# Patient Record
Sex: Female | Born: 1975 | Race: White | Hispanic: No | Marital: Married | Smoking: Never smoker
Health system: Southern US, Community
[De-identification: ages and names within clinical notes are randomized; demographics above are authoritative.]

## PROBLEM LIST (undated history)

## (undated) DIAGNOSIS — G43909 Migraine, unspecified, not intractable, without status migrainosus: Secondary | ICD-10-CM

## (undated) DIAGNOSIS — Z9889 Other specified postprocedural states: Secondary | ICD-10-CM

## (undated) DIAGNOSIS — M7070 Other bursitis of hip, unspecified hip: Secondary | ICD-10-CM

## (undated) DIAGNOSIS — R51 Headache: Secondary | ICD-10-CM

## (undated) DIAGNOSIS — L719 Rosacea, unspecified: Secondary | ICD-10-CM

## (undated) DIAGNOSIS — K3184 Gastroparesis: Secondary | ICD-10-CM

## (undated) DIAGNOSIS — F419 Anxiety disorder, unspecified: Secondary | ICD-10-CM

## (undated) DIAGNOSIS — M419 Scoliosis, unspecified: Secondary | ICD-10-CM

## (undated) DIAGNOSIS — Z8489 Family history of other specified conditions: Secondary | ICD-10-CM

## (undated) DIAGNOSIS — K219 Gastro-esophageal reflux disease without esophagitis: Secondary | ICD-10-CM

## (undated) DIAGNOSIS — M1990 Unspecified osteoarthritis, unspecified site: Secondary | ICD-10-CM

## (undated) DIAGNOSIS — Z5189 Encounter for other specified aftercare: Secondary | ICD-10-CM

## (undated) DIAGNOSIS — R112 Nausea with vomiting, unspecified: Secondary | ICD-10-CM

## (undated) DIAGNOSIS — E7800 Pure hypercholesterolemia, unspecified: Secondary | ICD-10-CM

## (undated) HISTORY — DX: Other bursitis of hip, unspecified hip: M70.70

## (undated) HISTORY — DX: Scoliosis, unspecified: M41.9

## (undated) HISTORY — DX: Encounter for other specified aftercare: Z51.89

## (undated) HISTORY — PX: TONSILLECTOMY: SUR1361

## (undated) HISTORY — DX: Gastro-esophageal reflux disease without esophagitis: K21.9

## (undated) HISTORY — DX: Unspecified osteoarthritis, unspecified site: M19.90

## (undated) HISTORY — PX: WISDOM TOOTH EXTRACTION: SHX21

---
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
## 2006-07-18 HISTORY — PX: HAND TENDON SURGERY: SHX663

## 2011-03-15 ENCOUNTER — Emergency Department (HOSPITAL_COMMUNITY)
Admission: EM | Admit: 2011-03-15 | Discharge: 2011-03-15 | Disposition: A | Discharge status: Home / Self Care | Payer: Medicaid Other | Attending: Emergency Medicine | Admitting: Emergency Medicine

## 2011-03-15 ENCOUNTER — Emergency Department (HOSPITAL_COMMUNITY): Payer: Medicaid Other

## 2011-03-15 DIAGNOSIS — M79609 Pain in unspecified limb: Secondary | ICD-10-CM | POA: Insufficient documentation

## 2011-03-15 DIAGNOSIS — M7989 Other specified soft tissue disorders: Secondary | ICD-10-CM | POA: Insufficient documentation

## 2011-03-15 DIAGNOSIS — M129 Arthropathy, unspecified: Secondary | ICD-10-CM | POA: Insufficient documentation

## 2011-03-15 DIAGNOSIS — S62319A Displaced fracture of base of unspecified metacarpal bone, initial encounter for closed fracture: Secondary | ICD-10-CM | POA: Insufficient documentation

## 2011-03-15 DIAGNOSIS — W2209XA Striking against other stationary object, initial encounter: Secondary | ICD-10-CM | POA: Insufficient documentation

## 2011-03-15 DIAGNOSIS — Y92009 Unspecified place in unspecified non-institutional (private) residence as the place of occurrence of the external cause: Secondary | ICD-10-CM | POA: Insufficient documentation

## 2012-09-06 DIAGNOSIS — M47816 Spondylosis without myelopathy or radiculopathy, lumbar region: Secondary | ICD-10-CM | POA: Insufficient documentation

## 2012-09-06 DIAGNOSIS — M545 Low back pain, unspecified: Secondary | ICD-10-CM | POA: Insufficient documentation

## 2013-08-13 ENCOUNTER — Other Ambulatory Visit: Payer: Self-pay | Admitting: Gastroenterology

## 2013-09-09 ENCOUNTER — Encounter (HOSPITAL_COMMUNITY)
Admission: RE | Disposition: A | Discharge status: Home / Self Care | Payer: Self-pay | Source: Ambulatory Visit | Attending: Gastroenterology

## 2013-09-09 ENCOUNTER — Ambulatory Visit (HOSPITAL_COMMUNITY)
Admission: RE | Admit: 2013-09-09 | Discharge: 2013-09-09 | Disposition: A | Discharge status: Home / Self Care | Payer: Medicaid Other | Source: Ambulatory Visit | Attending: Gastroenterology | Admitting: Gastroenterology

## 2013-09-09 DIAGNOSIS — K219 Gastro-esophageal reflux disease without esophagitis: Secondary | ICD-10-CM | POA: Insufficient documentation

## 2013-09-09 HISTORY — PX: ESOPHAGEAL MANOMETRY: SHX5429

## 2013-09-09 SURGERY — MANOMETRY, ESOPHAGUS

## 2013-09-09 MED ORDER — LIDOCAINE VISCOUS 2 % MT SOLN
OROMUCOSAL | Status: AC
  Filled 2013-09-09: qty 15

## 2013-09-09 SURGICAL SUPPLY — 1 items: FACESHIELD LNG OPTICON STERILE (SAFETY) IMPLANT

## 2013-09-10 ENCOUNTER — Encounter (HOSPITAL_COMMUNITY): Payer: Self-pay | Admitting: Gastroenterology

## 2013-09-10 NOTE — H&P (Signed)
   Richardo PriestBelinda Loftus HPI: This is a 38 year old female with a long history of GERD.  She feels that her GERD has worsened.  Omeprazole helps, but she still has significant problems.  The patient most likely requires a surgical evaluation and a manometry is pursued for further evaluation.  No past medical history on file.  Past Surgical History  Procedure Laterality Date  . Esophageal manometry [name] 09/09/2013    Procedure: ESOPHAGEAL MANOMETRY (EM);  Surgeon: Theda BelfastPatrick D Irma Delancey, MD;  Location: [facility];  Service: Endoscopy;  Laterality: [name];    No family history on file.  Social History:  has no tobacco, alcohol, and drug history on file.  Allergies: Allergies not on file  Medications: Scheduled: Continuous:  No results found for this or any previous visit (from the past 24 hour(s)).   No results found.  ROS:  As stated above in the HPI otherwise negative.  Height 5\' 4"  (1.626 m).      Assessment/Plan: 1) GERD.  Plan: 1) Manometry.  Dalonte Hardage D 09/10/2013, 3:31 PM

## 2013-09-27 ENCOUNTER — Ambulatory Visit (INDEPENDENT_AMBULATORY_CARE_PROVIDER_SITE_OTHER): Payer: Medicaid Other | Admitting: Surgery

## 2013-09-27 ENCOUNTER — Encounter (INDEPENDENT_AMBULATORY_CARE_PROVIDER_SITE_OTHER): Payer: Self-pay | Admitting: Surgery

## 2013-09-27 ENCOUNTER — Encounter (INDEPENDENT_AMBULATORY_CARE_PROVIDER_SITE_OTHER): Payer: Self-pay

## 2013-09-27 VITALS — BP 118/80 | HR 74 | Temp 99.5°F | Resp 16 | Ht 63.0 in | Wt 138.0 lb

## 2013-09-27 DIAGNOSIS — K219 Gastro-esophageal reflux disease without esophagitis: Secondary | ICD-10-CM

## 2013-09-27 NOTE — Progress Notes (Signed)
Subjective:     Patient ID: Theresa Shelton, female   DOB: October 27, 1975, 38 y.o.   MRN: [id number]  HPI  Note: This dictation was prepared with Dragon/digital dictation along with [facility] technology. Any transcriptional errors that result from this process are unintentional.       Theresa Shelton  10-20-1975 [id number]  Patient Care Team: Ailene Ravel, MD as PCP - General (Family Medicine) Theda Belfast, MD as Consulting Physician (Gastroenterology) Ardeth Sportsman, MD as Consulting Physician (General Surgery)  This patient is a 38 y.o.female who presents today for surgical evaluation at the request of Dr. Elnoria Howard.   Reason for visit: Worsening GERD intractable to medical management  Pleasant white female does struggle with heartburn and reflux for 15 years.  She comes in today with her very active 21-year-old son.  She had chronic burning to the back of her throat.  Poor food tolerance.  Ice chips/water.  Saw commercial concern for reflux.  Mentioned her primary care physician.  Started on an acid medicines.  She has been on most H2 blockers and proton pump inhibitors since that time.  Usually a medicine will help for 6 months.  Then things will dwindle in effectiveness.  Medicines will be increased or switched.  Then that seems to help for 3-6 motnhs.  She is made major diet modifications.  She stopped soda, fast food, caffeine, chocolate.  She is on a more organic Paleo type diet.  Those have helped.  She is intentionally lost 40 pounds of weight.  However Things have progressive [name] worsened and now the medicines, weight loss, and diet changes have Stopped being in a more effective and she still struggles appear  She still struggles with reflux.  Could not tolerate lying flat or bending over unless has her medicines.  Symptoms get markedly worse if she misses a dose.  She has had a couple his episodes of severe epigastric and chest pain.  Felt not to be cardiac or pulmonary etiology.   Normally hikes with her family 4-6 miles without problems.  3 bowel movements a day.  No personal nor family history of GI/colon cancer, inflammatory bowel disease, irritable bowel syndrome, allergy such as Celiac Sprue, dietary/dairy problems, colitis, ulcers nor gastritis.  No recent sick contacts/gastroenteritis.  No travel outside the country.  No changes in diet.  No dysphagia to solids or liquids.  No hematochezia, hematemesis, coffee ground emesis.  No evidence of prior gastric/peptic ulceration.  She saw gastroenterology.  Dr. Elnoria Howard performed endoscopy 2 years ago.  She recalls being told it was underwhelming.  I do not have the report in front of me.  Not able to get that record today.  Dr. Elnoria Howard saw the patient again this year.  On the significant acid suppression.  Still struggling.  Manometry performed over concerns of crampy chest pain perhaps being esophageal spasms.  That is normal.  Surgical consultation requested to see if benefit of fundoplication for better control of significant GERD    Patient Active Problem List   Diagnosis Date Noted  . GERD (gastroesophageal reflux disease) 09/27/2013    Past Medical History  Diagnosis Date  . GERD (gastroesophageal reflux disease)   . Blood transfusion without reported diagnosis   . Arthritis     Past Surgical History  Procedure Laterality Date  . Esophageal manometry [name] 09/09/2013    Procedure: ESOPHAGEAL MANOMETRY (EM);  Surgeon: Theda Belfast, MD;  Location: [facility];  Service: Endoscopy;  Laterality: [name];  History   Social History  . Marital Status: Single    Spouse Name: [name]    Number of Children: [name]  . Years of Education: [name]   Occupational History  . Not on file.   Social History Main Topics  . Smoking status: Never Smoker   . Smokeless tobacco: Not on file  . Alcohol Use: Yes  . Drug Use: No  . Sexual Activity: Not on file   Other Topics Concern  . Not on file   Social History Narrative  . No  narrative on file    History reviewed. No pertinent family history.  Current Outpatient Prescriptions  Medication Sig Dispense Refill  . Levonorgestrel-Ethinyl Estradiol (AMETHIA,CAMRESE) 0.1-0.02 & 0.01 MG tablet Take 1 tablet by mouth daily.      . meloxicam (MOBIC) 7.5 MG tablet Take 7.5 mg by mouth daily.      Marland Kitchen omeprazole (PRILOSEC) 40 MG capsule Take 40 mg by mouth daily.       No current facility-administered medications for this visit.     Allergies  Allergen Reactions  . Amoxicillin   . Clindamycin/Lincomycin   . Septra [Sulfamethoxazole-Tmp Ds]     BP 118/80  Pulse 74  Temp(Src) 99.5 F (37.5 C) (Oral)  Resp 16  Ht 5\' 3"  (1.6 m)  Wt 138 lb (62.596 kg)  BMI 24.45 kg/m2  No results found.   Review of Systems  Constitutional: Negative for fever, chills, diaphoresis, appetite change and fatigue.  HENT: Negative for ear discharge, ear pain, sore throat and trouble swallowing.   Eyes: Negative for photophobia, discharge and visual disturbance.  Respiratory: Negative for cough, choking, chest tightness, shortness of breath, wheezing and stridor.   Cardiovascular: Positive for chest pain. Negative for palpitations and leg swelling.  Gastrointestinal: Positive for abdominal pain. Negative for nausea, vomiting, constipation, blood in stool, abdominal distention, anal bleeding and rectal pain.  Endocrine: Negative for cold intolerance and heat intolerance.  Genitourinary: Negative for dysuria, frequency, decreased urine volume, difficulty urinating and pelvic pain.  Musculoskeletal: Positive for arthralgias and back pain. Negative for gait problem, joint swelling, neck pain and neck stiffness.  Skin: Negative for color change, pallor and rash.  Allergic/Immunologic: Negative for environmental allergies, food allergies and immunocompromised state.  Neurological: Negative for dizziness, speech difficulty, weakness and numbness.  Hematological: Negative for adenopathy.   Psychiatric/Behavioral: Negative for confusion and agitation. The patient is not nervous/anxious.        Objective:   Physical Exam  Constitutional: She is oriented to person, place, and time. She appears well-developed and well-nourished. No distress.  HENT:  Head: Normocephalic.  Mouth/Throat: Oropharynx is clear and moist. No oropharyngeal exudate.  Eyes: Conjunctivae and EOM are normal. Pupils are equal, round, and reactive to light. No scleral icterus.  Neck: Normal range of motion. Neck supple. No tracheal deviation present.  Cardiovascular: Normal rate, regular rhythm and intact distal pulses.   Pulmonary/Chest: Effort normal and breath sounds normal. No stridor. No respiratory distress. She exhibits no tenderness.  Abdominal: Soft. She exhibits no distension and no mass. There is no tenderness. Hernia confirmed negative in the right inguinal area and confirmed negative in the left inguinal area.  Genitourinary: No vaginal discharge found.  Musculoskeletal: Normal range of motion. She exhibits no tenderness.       Right elbow: She exhibits normal range of motion.       Left elbow: She exhibits normal range of motion.       Right wrist: She  exhibits normal range of motion.       Left wrist: She exhibits normal range of motion.       Right hand: Normal strength noted.       Left hand: Normal strength noted.  Lymphadenopathy:       Head (right side): No posterior auricular adenopathy present.       Head (left side): No posterior auricular adenopathy present.    She has no cervical adenopathy.    She has no axillary adenopathy.       Right: No inguinal adenopathy present.       Left: No inguinal adenopathy present.  Neurological: She is alert and oriented to person, place, and time. No cranial nerve deficit. She exhibits normal muscle tone. Coordination normal.  Skin: Skin is warm and dry. No rash noted. She is not diaphoretic. No erythema.  Psychiatric: She has a normal mood and  affect. Judgment and thought content normal. Her speech is not tangential and not slurred. She is hyperactive. She is not agitated and not aggressive. Cognition and memory are normal.  Somewhat restless.  Not manic but with some pressured speech.  Pleasant.       Assessment:     Story and very classic for end-stage GERD / reflux.     Plan:     I suspect she would benefit from fundoplication.  Excellent exercise tolerance makes cardiopulmonary etiology unlikely.  Hepatobiliary pancreatic etiology unlikely as well.  Manometry disproves any primary esophageal disorders.  However, would like some objective proof of reflux.  Obtain EGD report.  If no evidence of esophagitis, consider a 48 hour pH probe Bravo off PPIs to document reflux.  Will discuss with Dr. Elnoria HowardHung.  Obtain gastric emptying study to make sure that gastroparesis is not a contributor.  Given the lack of significant bloating constipation, probably unlikely.  However, I wished to be thorough and the workup.  I did discuss what fundoplication would entail:  The anatomy & physiology of the foregut and anti-reflux mechanism was discussed.  The pathophysiology of GERD was discussed.  Natural history risks without surgery was discussed.   The patient's symptoms are not adequately controlled by medicines and other non-operative treatments.  I feel the risks of no intervention will lead to serious problems that outweigh the operative risks; therefore, I recommended surgery to  rebuild the anti-reflux valve and control reflux better.  Need for a thorough workup to rule out the differential diagnosis and plan treatment was explained.  I explained laparoscopic techniques with possible need for an open approach.  Risks such as bleeding, infection, abscess, leak, need for further treatment, heart attack, death, and other risks were discussed.   I noted a good likelihood this will help address the problem.  Goals of post-operative recovery were  discussed as well.  Possibility that this will not correct all symptoms was explained.  Post-operative dysphagia, need for short-term liquid & pureed diet, inability to vomit, possibility of wrap slippage or herniation, possible need for medicines to help control symptoms in addition to surgery were discussed.  We will work to minimize complications.   Educational handouts further explaining the pathology, treatment options, and dysphagia diet was given as well.  Questions were answered.  The patient expresses understanding & wishes to proceed with surgery.

## 2013-09-27 NOTE — Patient Instructions (Signed)
Please consider the recommendations that we have given you today:  Please obtain gastric emptying study x-ray to make sure her stomach functions normally.  We will work to get a copy of area EGD endoscopy report from 2 years ago.  If that is totally normal, you may need a pH probe study set up through Dr. Haywood PaoHung's office.  If that is abnormal, implying reflux, then probably proceed with Nissen fundoplication heartburn surgery  See the Handout(s) we have given you.  Please call our office at [phone] if you wish to schedule surgery or if you have further questions / concerns.   Gastroesophageal Reflux Disease, Adult Gastroesophageal reflux disease (GERD) happens when acid from your stomach flows up into the esophagus. When acid comes in contact with the esophagus, the acid causes soreness (inflammation) in the esophagus. Over time, GERD may create small holes (ulcers) in the lining of the esophagus. CAUSES   Increased body weight. This puts pressure on the stomach, making acid rise from the stomach into the esophagus.  Smoking. This increases acid production in the stomach.  Drinking alcohol. This causes decreased pressure in the lower esophageal sphincter (valve or ring of muscle between the esophagus and stomach), allowing acid from the stomach into the esophagus.  Late evening meals and a full stomach. This increases pressure and acid production in the stomach.  A malformed lower esophageal sphincter. Sometimes, no cause is found. SYMPTOMS   Burning pain in the lower part of the mid-chest behind the breastbone and in the mid-stomach area. This may occur twice a week or more often.  Trouble swallowing.  Sore throat.  Dry cough.  Asthma-like symptoms including chest tightness, shortness of breath, or wheezing. DIAGNOSIS  Your caregiver may be able to diagnose GERD based on your symptoms. In some cases, X-rays and other tests may be done to check for complications or to check  the condition of your stomach and esophagus. TREATMENT  Your caregiver may recommend over-the-counter or prescription medicines to help decrease acid production. Ask your caregiver before starting or adding any new medicines.  HOME CARE INSTRUCTIONS   Change the factors that you can control. Ask your caregiver for guidance concerning weight loss, quitting smoking, and alcohol consumption.  Avoid foods and drinks that make your symptoms worse, such as:  Caffeine or alcoholic drinks.  Chocolate.  Peppermint or mint flavorings.  Garlic and onions.  Spicy foods.  Citrus fruits, such as oranges, lemons, or limes.  Tomato-based foods such as sauce, chili, salsa, and pizza.  Fried and fatty foods.  Avoid lying down for the 3 hours prior to your bedtime or prior to taking a nap.  Eat small, frequent meals instead of large meals.  Wear loose-fitting clothing. Do not wear anything tight around your waist that causes pressure on your stomach.  Raise the head of your bed 6 to 8 inches with wood blocks to help you sleep. Extra pillows will not help.  Only take over-the-counter or prescription medicines for pain, discomfort, or fever as directed by your caregiver.  Do not take aspirin, ibuprofen, or other nonsteroidal anti-inflammatory drugs (NSAIDs). SEEK IMMEDIATE MEDICAL CARE IF:   You have pain in your arms, neck, jaw, teeth, or back.  Your pain increases or changes in intensity or duration.  You develop nausea, vomiting, or sweating (diaphoresis).  You develop shortness of breath, or you faint.  Your vomit is green, yellow, black, or looks like coffee grounds or blood.  Your stool is red, bloody,  or black. These symptoms could be signs of other problems, such as heart disease, gastric bleeding, or esophageal bleeding. MAKE SURE YOU:   Understand these instructions.  Will watch your condition.  Will get help right away if you are not doing well or get worse. Document  Released: 04/13/2005 Document Revised: 09/26/2011 Document Reviewed: 01/21/2011 [facility] Patient Information 2014 [place], [place].  Diet for Gastroesophageal Reflux Disease, Adult Reflux (acid reflux) is when acid from your stomach flows up into the esophagus. When acid comes in contact with the esophagus, the acid causes irritation and soreness (inflammation) in the esophagus. When reflux happens often or so severely that it causes damage to the esophagus, it is called gastroesophageal reflux disease (GERD). Nutrition therapy can help ease the discomfort of GERD. FOODS OR DRINKS TO AVOID OR LIMIT  Smoking or chewing tobacco. Nicotine is one of the most potent stimulants to acid production in the gastrointestinal tract.  Caffeinated and decaffeinated coffee and black tea.  Regular or low-calorie carbonated beverages or energy drinks (caffeine-free carbonated beverages are allowed).   Strong spices, such as black pepper, white pepper, red pepper, cayenne, curry powder, and chili powder.  Peppermint or spearmint.  Chocolate.  High-fat foods, including meats and fried foods. Extra added fats including oils, butter, salad dressings, and nuts. Limit these to less than 8 tsp per day.  Fruits and vegetables if they are not tolerated, such as citrus fruits or tomatoes.  Alcohol.  Any food that seems to aggravate your condition. If you have questions regarding your diet, call your caregiver or a registered dietitian. OTHER THINGS THAT MAY HELP GERD INCLUDE:   Eating your meals slowly, in a relaxed setting.  Eating 5 to 6 small meals per day instead of 3 large meals.  Eliminating food for a period of time if it causes distress.  Not lying down until 3 hours after eating a meal.  Keeping the head of your bed raised 6 to 9 inches (15 to 23 cm) by using a foam wedge or blocks under the legs of the bed. Lying flat may make symptoms worse.  Being physically active. Weight loss may be helpful  in reducing reflux in overweight or obese adults.  Wear loose fitting clothing EXAMPLE MEAL PLAN This meal plan is approximately 2,000 calories based on https://www.bernard.org/ meal planning guidelines. Breakfast   cup cooked oatmeal.  1 cup strawberries.  1 cup low-fat milk.  1 oz almonds. Snack  1 cup cucumber slices.  6 oz yogurt (made from low-fat or fat-free milk). Lunch  2 slice whole-wheat bread.  2 oz sliced [place].  2 tsp mayonnaise.  1 cup blueberries.  1 cup snap peas. Snack  6 whole-wheat crackers.  1 oz string cheese. Dinner   cup brown rice.  1 cup mixed veggies.  1 tsp olive oil.  3 oz grilled fish. Document Released: 07/04/2005 Document Revised: 09/26/2011 Document Reviewed: 05/20/2011 [facility] Patient Information 2014 [place], [place].  EATING AFTER YOUR ESOPHAGEAL SURGERY (Stomach Fundoplication, Hiatal Hernia repair, Achalasia surgery, etc)  After your esophageal surgery, expect some sticking with swallowing over the next 1-2 months.    If food sticks when you eat, it is called "dysphagia".  This is due to swelling around your esophagus at the wrap & hiatal diaphragm repair.  It will gradually ease off over the next few months.  To help you through this temporary phase, we start you out on a pureed (blenderized) diet.  Your first meal in the hospital was thin  liquids.  You should have been given a pureed diet by the time you left the hospital.  We ask patients to stay on a pureed diet for the first 2-3 weeks to avoid anything getting "stuck" near your recent surgery.  Don't be alarmed if your ability to swallow doesn't progress according to this plan.  Everyone is different and some diets can advance more or less quickly.     Some BASIC RULES to follow are:  Maintain an upright position whenever eating or drinking.  Take small bites - just a teaspoon size bite at a time.  Eat slowly.  It may also help to eat only one food at a  time.  Consider nibbling through smaller, more frequent meals & avoid the urge to eat BIG meals  Do not push through feelings of fullness, nausea, or bloatedness  Do not mix solid foods and liquids in the same mouthful  Try not to "wash foods down" with large gulps of liquids. Avoid carbonated (bubbly/fizzy) drinks.  Understand that it will be hard to burp and belch at first.  This gradually improves with time.  Expect to be more gassy/flatulent/bloated initially.  Walking will help you work through that.  Maalox/Gas-X can help as well.  Eat in a relaxed atmosphere & minimize distractions.  Avoid talking while eating.    Do not use straws.  Following each meal, sit in an upright position (90 degree angle) for 60 to 90 minutes.  Going for a short walk can help as well  If food does stick, don't panic.  Try to relax and let the food pass on its own.  Sipping WARM LIQUID such as strong hot black tea can also help slide it down.   Be gradual in changes & use common sense:  -If you easily tolerating a certain "level" of foods, advance to the next level gradually -If you are having trouble swallowing a particular food, then avoid it.   -If food is sticking when you advance your diet, go back to thinner previous diet (the lower LEVEL) for 1-2 days.  LEVEL 1 = PUREED DIET  Do for the first 2 WEEKS AFTER SURGERY  -Foods in this group are pureed or blenderized to a smooth, mashed potato-like consistency.  -If necessary, the pureed foods can keep their shape with the addition of a thickening agent.   -Meat should be pureed to a smooth, pasty consistency.  Hot broth or gravy may be added to the pureed meat, approximately 1 oz. of liquid per 3 oz. serving of meat. -CAUTION:  If any foods do not puree into a smooth consistency, swallowing will be more difficult.  (For example, nuts or seeds sometimes do not blend well.)  Hot Foods Cold Foods  Pureed scrambled eggs and cheese Pureed cottage  cheese  Baby cereals Thickened juices and nectars  Thinned cooked cereals (no lumps) Thickened milk or eggnog  Pureed [place] toast or pancakes Ensure  Mashed potatoes Ice cream  Pureed parsley, au gratin, scalloped potatoes, candied sweet potatoes Fruit or [place] ice, sherbet  Pureed buttered or alfredo noodles Plain yogurt  Pureed vegetables (no corn or peas) Instant breakfast  Pureed soups and creamed soups Smooth pudding, mousse, custard  Pureed scalloped apples Whipped gelatin  Gravies Sugar, syrup, honey, jelly  Sauces, cheese, tomato, barbecue, white, creamed Cream  Any baby food Creamer  Alcohol in moderation (not beer or champagne) Margarine  Coffee or tea Mayonnaise   Ketchup, mustard   Apple sauce  SAMPLE MENU:  PUREED DIET Breakfast Lunch Dinner   Orange juice, 1/2 cup  Cream of wheat, 1/2 cup  Pineapple juice, 1/2 cup  Pureed [place], barley soup, 3/4 cup  Pureed Hawaiian chicken, 3 oz   Scrambled eggs, mashed or blended with cheese, 1/2 cup  Tea or coffee, 1 cup   Whole milk, 1 cup   Non-dairy creamer, 2 Tbsp.  Mashed potatoes, 1/2 cup  Pureed cooled broccoli, 1/2 cup  Apple sauce, 1/2 cup  Coffee or tea  Mashed potatoes, 1/2 cup  Pureed spinach, 1/2 cup  Frozen yogurt, 1/2 cup  Tea or coffee      LEVEL 2 = SOFT DIET  After your first 2 weeks, you can advance to a soft diet.   Keep on this diet until everything goes down easily.  Hot Foods Cold Foods  White fish Cottage cheese  Stuffed fish Junior baby fruit  Baby food meals Semi thickened juices  Minced soft cooked, scrambled, poached eggs nectars  Souffle & omelets Ripe mashed bananas  Cooked cereals Canned fruit, pineapple sauce, milk  potatoes Milkshake  Buttered or Alfredo noodles Custard  Cooked cooled vegetable Puddings, including tapioca  Sherbet Yogurt  Vegetable soup or alphabet soup Fruit ice, [place] ice  Gravies Whipped gelatin  Sugar, syrup, honey, jelly Junior baby  desserts  Sauces:  Cheese, creamed, barbecue, tomato, white Cream  Coffee or tea Margarine   SAMPLE MENU:  LEVEL 2 Breakfast Lunch Dinner   Orange juice, 1/2 cup  Oatmeal, 1/2 cup  Scrambled eggs with cheese, 1/2 cup  Decaffeinated tea, 1 cup  Whole milk, 1 cup  Non-dairy creamer, 2 Tbsp  Pineapple juice, 1/2 cup  Minced beef, 3 oz  Gravy, 2 Tbsp  Mashed potatoes, 1/2 cup  Minced fresh broccoli, 1/2 cup  Applesauce, 1/2 cup  Coffee, 1 cup  [place], barley soup, 3/4 cup  Minced Hawaiian chicken, 3 oz  Mashed potatoes, 1/2 cup  Cooked spinach, 1/2 cup  Frozen yogurt, 1/2 cup  Non-dairy creamer, 2 Tbsp      LEVEL 3 = CHOPPED DIET  -After all the foods in level 2 (soft diet) are passing through well you should advance up to more chopped foods.  -It is still important to cut these foods into small pieces and eat slowly.  Hot Foods Cold Foods  Poultry Cottage cheese  Chopped Swedish meatballs Yogurt  Meat salads (ground or flaked meat) Milk  Flaked fish (tuna) Milkshakes  Poached or scrambled eggs Soft, cold, dry cereal  Souffles and omelets Fruit juices or nectars  Cooked cereals Chopped canned fruit  Chopped [place] toast or pancakes Canned fruit cocktail  Noodles or pasta (no rice) Pudding, mousse, custard  Cooked vegetables (no frozen peas, corn, or mixed vegetables) Green salad  Canned small sweet peas Ice cream  Creamed soup or vegetable soup Fruit ice, [place] ice  Pureed vegetable soup or alphabet soup Non-dairy creamer  Ground scalloped apples Margarine  Gravies Mayonnaise  Sauces:  Cheese, creamed, barbecue, tomato, white Ketchup  Coffee or tea Mustard   SAMPLE MENU:  LEVEL 3 Breakfast Lunch Dinner   Orange juice, 1/2 cup  Oatmeal, 1/2 cup  Scrambled eggs with cheese, 1/2 cup  Decaffeinated tea, 1 cup  Whole milk, 1 cup  Non-dairy creamer, 2 Tbsp  Ketchup, 1 Tbsp  Margarine, 1 tsp  Salt, 1/4 tsp  Sugar, 2 tsp  Pineapple  juice, 1/2 cup  Ground beef, 3 oz  Gravy, 2 Tbsp  Mashed potatoes,  1/2 cup  Cooked spinach, 1/2 cup  Applesauce, 1/2 cup  Decaffeinated coffee  Whole milk  Non-dairy creamer, 2 Tbsp  Margarine, 1 tsp  Salt, 1/4 tsp  Pureed [place], barley soup, 3/4 cup  Barbecue chicken, 3 oz  Mashed potatoes, 1/2 cup  Ground fresh broccoli, 1/2 cup  Frozen yogurt, 1/2 cup  Decaffeinated tea, 1 cup  Non-dairy creamer, 2 Tbsp  Margarine, 1 tsp  Salt, 1/4 tsp  Sugar, 1 tsp    LEVEL 4:  REGULAR FOODS  -Foods in this group are soft, moist, regularly textured foods.   -This level includes meat and breads, which tend to be the hardest things to swallow.   -Eat very slowly, chew well and continue to avoid carbonated drinks. -most people are at this level in 4-6 weeks  Hot Foods Cold Foods  Baked fish or skinned Soft cheeses - cottage cheese  Souffles and omelets Cream cheese  Eggs Yogurt  Stuffed shells Milk  Spaghetti with meat sauce Milkshakes  Cooked cereal Cold dry cereals (no nuts, dried fruit, coconut)  [place] toast or pancakes Crackers  Buttered toast Fruit juices or nectars  Noodles or pasta (no rice) Canned fruit  Potatoes (all types) Ripe bananas  Soft, cooked vegetables (no corn, lima, or baked beans) Peeled, ripe, fresh fruit  Creamed soups or vegetable soup Cakes (no nuts, dried fruit, coconut)  Canned chicken noodle soup Plain doughnuts  Gravies Ice cream  Bacon dressing Pudding, mousse, custard  Sauces:  Cheese, creamed, barbecue, tomato, white Fruit ice, [place] ice, sherbet  Decaffeinated tea or coffee Whipped gelatin  Pork chops Regular gelatin   Canned fruited gelatin molds   Sugar, syrup, honey, jam, jelly   Cream   Non-dairy   Margarine   Oil   Mayonnaise   Ketchup   Mustard    If you have any questions please call our office at [facility]: [phone].

## 2013-09-30 ENCOUNTER — Telehealth (INDEPENDENT_AMBULATORY_CARE_PROVIDER_SITE_OTHER): Payer: Self-pay | Admitting: *Deleted

## 2013-09-30 NOTE — Telephone Encounter (Signed)
LMOM for pt to return my call.  I was calling to inform pt of the appt for her gastric emptying test scheduled at [facility] on 10/08/13 with an arrival time of 6:45am.  She is to be NPO after midnight and to hold all stomach meds (prilosec, etc.) after midnight as well.

## 2013-09-30 NOTE — Telephone Encounter (Signed)
Patient returned my call.  She is going to call radiology scheduling at [phone] to reschedule this appt.

## 2013-10-08 ENCOUNTER — Other Ambulatory Visit (HOSPITAL_COMMUNITY): Payer: Medicaid Other

## 2013-10-10 ENCOUNTER — Ambulatory Visit (HOSPITAL_COMMUNITY)
Admission: RE | Admit: 2013-10-10 | Discharge: 2013-10-10 | Disposition: A | Discharge status: Home / Self Care | Payer: Medicaid Other | Source: Ambulatory Visit | Attending: Surgery | Admitting: Surgery

## 2013-10-10 ENCOUNTER — Telehealth (INDEPENDENT_AMBULATORY_CARE_PROVIDER_SITE_OTHER): Payer: Self-pay

## 2013-10-10 DIAGNOSIS — R109 Unspecified abdominal pain: Secondary | ICD-10-CM | POA: Insufficient documentation

## 2013-10-10 DIAGNOSIS — K219 Gastro-esophageal reflux disease without esophagitis: Secondary | ICD-10-CM | POA: Insufficient documentation

## 2013-10-10 MED ORDER — TECHNETIUM TC 99M SULFUR COLLOID
2.0000 | Freq: Once | INTRAVENOUS | Status: AC | PRN
  Administered 2013-10-10: 2 via INTRAVENOUS

## 2013-10-10 NOTE — Telephone Encounter (Signed)
LMOM for pt to call me back so I can discuss test results with pt. I want the pt to also call Morrie SheldonAshley at [facility] to get scheduled for another test ph probe/Bravo study per DR Gross and Dr Elnoria HowardHung.

## 2013-10-17 NOTE — Telephone Encounter (Signed)
Pt called into the office asking to speak to Dr Michaell CowingGross but I explained that Dr Michaell CowingGross was out of the office for the next 2 weeks. I notified pt that I was Dr Gordy SaversGross's assistant in the office and that I had left her a message to call me with the gastric emptying study results. The pt is so upset at the moment b/c she had received a call from Dr Haywood PaoHung's office that he wanted to see her in 19mo. To f/u on her gastroparesis. The pt was very shocked to find out that she has this dx but also doesn't understand the dx so she was asking questions at Dr Haywood PaoHung's office but kept being put on hold by the front desk. The pt got told that she needed to be on a liquid diet b/c of the stomach being paralyzed and pt needed to see Dr Elnoria HowardHung in a month. The pt then very upset was trying to google the gastroparesis dx while she kept being put on hold by Dr Haywood PaoHung's office. The pt wanted to speak to Dr Elnoria HowardHung but he was not in the office. The pt then called us and got me on triage. I explained to the pt that I had left her a message and the pt did receive my message. The pt had stuff going on last week with her son so she put calling me off till this week just not expecting to hear from Dr Haywood PaoHung's office. I advised pt that Dr Michaell CowingGross gave me the results of the gastric study showing severe gastroparesis but pt would benefit from concurrent pyloroplasty at the same time of the fundoplication. I advised pt that Dr Michaell CowingGross did not mention anything about pt being on a liquid diet or feeding tubes. I asked the pt if Dr Michaell CowingGross went over in details about the fundoplication and the pt did get a lot of information about the surgery the last visit. The pt feels much better now after speaking with me and getting the results from Dr Michaell CowingGross. I advised pt that I will call her once I speak to Dr Michaell CowingGross when he gets back into the office b/c I will get surgical orders started to get to scheduling. I asked the pt if she would like an office visit or phone call from Dr Michaell CowingGross  and the pt would be fine with either one as long as she gets some answers to the dx of gastroparesis.

## 2013-10-27 NOTE — Telephone Encounter (Signed)
Pt would benefit from concurrent pyloroplasty should she proceed w Nissen fundoplication.  I must have objective proof of GERD 1st, have documented reflux by pH probe study.   Alisha, please see if the pH probe study has been done yet by Dr Haywood PaoHung's office as I had d/w Dr Elnoria HowardHung.  No pH probe study = no proof of GERD = no recommendation of surgery (lap Nissen w probable concurrent pyloroplasty).

## 2013-10-28 ENCOUNTER — Other Ambulatory Visit: Payer: Self-pay | Admitting: Gastroenterology

## 2013-10-28 NOTE — Telephone Encounter (Signed)
Called pt to advise her that Dr Michaell CowingGross does want her to get the pH probe study done by Dr Elnoria HowardHung so we can have documentation about GERD. The pt said she will do anything that we need for her to do to get some answers about her situation. I advised pt that I was going to call Dr Haywood PaoHung's office to get the test scheduled again. The pt wants an appt to see Dr Michaell CowingGross to go over everything about the gastroparesis but I told the pt to call me once she has the appt with Dr Elnoria HowardHung for the pH probe study then I will make the appt with Dr Michaell CowingGross. The pt understands.

## 2013-10-28 NOTE — Telephone Encounter (Signed)
Called Shala with Dr Haywood PaoHung's office to see about getting the Ph probe study back on the schedule but they are confused about this thinking that Dr Michaell CowingGross wanted the study canceled. I tried to explain that there was some confusion b/c the pt got a gastric emptying study done that shows gastroparesis. The pt got the test results from Dr Haywood PaoHung's office instead from us b/c the results were forwarded to Dr Elnoria HowardHung for him to have a copy. Dr Michaell CowingGross has been out of the office for 2 weeks so we did not have a chance to go over the results yet with the pt before she found out by Dr Haywood PaoHung's office. The pt got really upset with Dr Haywood PaoHung's office on how the results were given to the pt. I tried to explain to [facility] again that we just needed to get the pH probe study r/s by Dr Elnoria HowardHung so we can get confirmation on GERD in order to proceed with offering surgery to the pt. Shala put Dr Elnoria HowardHung on the phone to talk to me b/c he thought the pt did not need the pH probe study with the pt having the dx of gastroparesis. Dr Elnoria HowardHung explained that he thought surgery would not be an option now b/c of the gastroparesis. I explained to Dr Elnoria HowardHung that Dr Michaell CowingGross is wanting the study to have documentation of proof for the GERD in order to see what surgically can be offered to the pt. Dr Elnoria HowardHung is fine with ordering the test again but he said he would send a note to Dr Michaell CowingGross just to make sure we are all on the same page. Dr Elnoria HowardHung is going to call the pt as well.

## 2013-10-29 NOTE — Telephone Encounter (Signed)
Pt called to give me the appt for the pH probe study/BRAVO that is scheduled by Dr Elnoria HowardHung for 11/14/13. The pt wants to make an appt with Dr Michaell CowingGross to discuss the gastric emptying study results showing gastroparesis. I advised pt that I would speak to Dr Michaell CowingGross in clinic tomorrow to find out about making an appt. I also mentioned that Dr Elnoria HowardHung will probably be the physician to follow her for the gastroparesis but the pt has so many questions and concerns now that she we would like answers to see if there is anything she can be doing to help now before sx. I advised pt that I would call her back Wednesday.

## 2013-10-29 NOTE — Telephone Encounter (Signed)
I would like a pH probe study done to prove/disprove GERD & dcomplete the foregut workup

## 2013-10-30 ENCOUNTER — Encounter (INDEPENDENT_AMBULATORY_CARE_PROVIDER_SITE_OTHER): Payer: Self-pay

## 2013-10-30 ENCOUNTER — Encounter (HOSPITAL_COMMUNITY): Payer: Self-pay | Admitting: Pharmacy Technician

## 2013-10-30 NOTE — Telephone Encounter (Signed)
Returned call. I explained to pt that she really needs an appt with Dr Michaell CowingGross [facility] b/c we will have more information to offer her about the surgery. I advised pt that Dr Elnoria HowardHung will be the physician to really follow her for the gastroparesis and the pt was seen today by Dr Elnoria HowardHung. The pt r/s her BRAVO from 4/30 to 5/14 b/c of a time issue. I will make an appt for the pt to see Dr Michaell CowingGross after the study. The pt understands.

## 2013-11-01 ENCOUNTER — Encounter (HOSPITAL_COMMUNITY): Payer: Self-pay | Admitting: *Deleted

## 2013-11-05 NOTE — Telephone Encounter (Signed)
Called to make f/u appt with Dr Michaell CowingGross for after [facility]. The pt tells me she has r/s the test again now for 11/21/13 with Dr Elnoria HowardHung. I have scheduled her to see Dr Michaell CowingGross on 11/27/13 arrive 11:30/11:45. I advised pt to check with Dr Elnoria HowardHung when she is getting her study done that her appt with Dr Michaell CowingGross will give Dr Elnoria HowardHung enough time to get the results back to Dr Michaell CowingGross b/c if not then we will need to push the appt out with Dr Michaell CowingGross till the results come back. The pt understands.

## 2013-11-21 ENCOUNTER — Ambulatory Visit (HOSPITAL_COMMUNITY)
Admission: RE | Admit: 2013-11-21 | Discharge: 2013-11-21 | Disposition: A | Discharge status: Home / Self Care | Payer: Medicaid Other | Source: Ambulatory Visit | Attending: Gastroenterology | Admitting: Gastroenterology

## 2013-11-21 ENCOUNTER — Encounter (HOSPITAL_COMMUNITY): Payer: Self-pay | Admitting: *Deleted

## 2013-11-21 ENCOUNTER — Encounter (HOSPITAL_COMMUNITY)
Admission: RE | Disposition: A | Discharge status: Home / Self Care | Payer: Self-pay | Source: Ambulatory Visit | Attending: Gastroenterology

## 2013-11-21 ENCOUNTER — Ambulatory Visit (HOSPITAL_COMMUNITY): Payer: Medicaid Other | Admitting: *Deleted

## 2013-11-21 ENCOUNTER — Encounter (HOSPITAL_COMMUNITY): Payer: Medicaid Other | Admitting: *Deleted

## 2013-11-21 DIAGNOSIS — K219 Gastro-esophageal reflux disease without esophagitis: Secondary | ICD-10-CM | POA: Insufficient documentation

## 2013-11-21 DIAGNOSIS — R112 Nausea with vomiting, unspecified: Secondary | ICD-10-CM

## 2013-11-21 DIAGNOSIS — Z981 Arthrodesis status: Secondary | ICD-10-CM | POA: Insufficient documentation

## 2013-11-21 DIAGNOSIS — Z9889 Other specified postprocedural states: Secondary | ICD-10-CM

## 2013-11-21 DIAGNOSIS — Z881 Allergy status to other antibiotic agents status: Secondary | ICD-10-CM | POA: Insufficient documentation

## 2013-11-21 DIAGNOSIS — Z880 Allergy status to penicillin: Secondary | ICD-10-CM | POA: Insufficient documentation

## 2013-11-21 HISTORY — DX: Nausea with vomiting, unspecified: R11.2

## 2013-11-21 HISTORY — DX: Other specified postprocedural states: Z98.890

## 2013-11-21 HISTORY — PX: BRAVO PH STUDY: SHX5421

## 2013-11-21 HISTORY — DX: Anxiety disorder, unspecified: F41.9

## 2013-11-21 HISTORY — PX: ESOPHAGOGASTRODUODENOSCOPY: SHX5428

## 2013-11-21 HISTORY — DX: Headache: R51

## 2013-11-21 LAB — PREGNANCY, URINE: PREG TEST UR: NEGATIVE

## 2013-11-21 SURGERY — EGD (ESOPHAGOGASTRODUODENOSCOPY)
Anesthesia: Monitor Anesthesia Care

## 2013-11-21 MED ORDER — LACTATED RINGERS IV SOLN
INTRAVENOUS | Status: DC
  Administered 2013-11-21: 13:00:00 via INTRAVENOUS
  Administered 2013-11-21: 1000 mL via INTRAVENOUS

## 2013-11-21 MED ORDER — PROPOFOL 10 MG/ML IV BOLUS
INTRAVENOUS | Status: DC | PRN
  Administered 2013-11-21: 40 mg via INTRAVENOUS

## 2013-11-21 MED ORDER — PROPOFOL INFUSION 10 MG/ML OPTIME
INTRAVENOUS | Status: DC | PRN
  Administered 2013-11-21: 160 ug/kg/min via INTRAVENOUS

## 2013-11-21 MED ORDER — ONDANSETRON HCL 4 MG/2ML IJ SOLN
INTRAMUSCULAR | Status: AC
  Filled 2013-11-21: qty 2

## 2013-11-21 MED ORDER — FENTANYL CITRATE 0.05 MG/ML IJ SOLN: 25.0000 ug | INTRAMUSCULAR | Status: DC | PRN

## 2013-11-21 MED ORDER — BUTAMBEN-TETRACAINE-BENZOCAINE 2-2-14 % EX AERO
INHALATION_SPRAY | CUTANEOUS | Status: DC | PRN
Start: 2013-11-21 — End: 2013-11-21
  Administered 2013-11-21: 2 via TOPICAL

## 2013-11-21 MED ORDER — METOCLOPRAMIDE HCL 5 MG/ML IJ SOLN
INTRAMUSCULAR | Status: AC
  Filled 2013-11-21: qty 2

## 2013-11-21 MED ORDER — KETAMINE HCL 10 MG/ML IJ SOLN
INTRAMUSCULAR | Status: DC | PRN
  Administered 2013-11-21: 10 mg via INTRAVENOUS
  Administered 2013-11-21: 20 mg via INTRAVENOUS

## 2013-11-21 MED ORDER — DEXAMETHASONE SODIUM PHOSPHATE 10 MG/ML IJ SOLN
INTRAMUSCULAR | Status: AC
  Filled 2013-11-21: qty 1

## 2013-11-21 MED ORDER — SODIUM CHLORIDE 0.9 % IV SOLN: INTRAVENOUS | Status: DC

## 2013-11-21 MED ORDER — PROPOFOL 10 MG/ML IV BOLUS
INTRAVENOUS | Status: AC
  Filled 2013-11-21: qty 40

## 2013-11-21 NOTE — Anesthesia Preprocedure Evaluation (Addendum)
Anesthesia Evaluation  Patient identified by MRN, date of birth, ID band Patient awake    Reviewed: Allergy & Precautions, H&P , NPO status , Patient's Chart, lab work & pertinent test results  Airway Mallampati: II TM Distance: >3 FB Neck ROM: Full    Dental no notable dental hx. (+) Dental Advisory Given, Teeth Intact   Pulmonary neg pulmonary ROS,  breath sounds clear to auscultation  Pulmonary exam normal       Cardiovascular negative cardio ROS  Rhythm:Regular Rate:Normal     Neuro/Psych negative neurological ROS  negative psych ROS   GI/Hepatic negative GI ROS, Neg liver ROS, GERD-  Medicated and Controlled,  Endo/Other  negative endocrine ROS  Renal/GU negative Renal ROS  negative genitourinary   Musculoskeletal negative musculoskeletal ROS (+)   Abdominal   Peds negative pediatric ROS (+)  Hematology negative hematology ROS (+)   Anesthesia Other Findings   Reproductive/Obstetrics negative OB ROS                          Anesthesia Physical Anesthesia Plan  ASA: I  Anesthesia Plan: MAC   Post-op Pain Management:    Induction:   Airway Management Planned:   Additional Equipment:   Intra-op Plan:   Post-operative Plan:   Informed Consent:   Plan Discussed with: Surgeon  Anesthesia Plan Comments:         Anesthesia Quick Evaluation

## 2013-11-21 NOTE — Op Note (Signed)
[facility] [address] [place], [id number]   OPERATIVE PROCEDURE REPORT  PATIENT: Theresa Shelton, Theresa Shelton  MR#: [id number] BIRTHDATE: 09-22-1975  GENDER: Female ENDOSCOPIST: Jeani HawkingPatrick Juni Glaab, MD ASSISTANT: PROCEDURE DATE: 11/21/2013 PROCEDURE:   EGD w/ Bravo capsule placement ASA CLASS:   Class III INDICATIONS: GERD MEDICATIONS: MAC sedation, administered by CRNA TOPICAL ANESTHETIC:   Cetacaine Spray  DESCRIPTION OF PROCEDURE:   After the risks benefits and alternatives of the procedure were thoroughly explained, informed consent was obtained.  The Pentax Gastroscope [id number]  endoscope was introduced through the mouth  and advanced to the second portion of the duodenum Without limitations.      The instrument was slowly withdrawn as the mucosa was fully examined.    FINDINGS:  The upper, middle and distal third of the esophagus were carefully inspected and no abnormalities were noted.  The z-line was well seen at the GEJ, which was at 39 cm.  The BRAVO probe was successfully deployed at 33 cm.  The endoscope was pushed into the fundus which was normal including a retroflexed view.  The antrum, gastric body, first and second part of the duodenum were unremarkable.          The scope was then withdrawn from the patient and the procedure terminated.  COMPLICATIONS: There were no complications.  IMPRESSION: 1) Normal EGD.  BRAVO at 33 cm.  RECOMMENDATIONS: 1) Await BRAVO results.  _______________________________ eSignedJeani Hawking:  Beaumont Austad, MD 11/21/2013 2:04 PM    PATIENT NAME:  Theresa Shelton, Theresa Shelton MR#: [id number]

## 2013-11-21 NOTE — H&P (Signed)
   Theresa PriestBelinda Shelton HPI: This is a 38 year old female with complaints of GERD.  Her EGD was unremarkable, but she requires the persistent use of PPIs.  Before finalizing the decision for a Nissen fundoplication, Dr. Michaell CowingGross requests a BRAVO test.  Pending the results further surgical recommendations will be made.  Past Medical History  Diagnosis Date  . GERD (gastroesophageal reflux disease)   . Blood transfusion without reported diagnosis   . Arthritis   . Hip bursitis   . Scoliosis   . Headache(784.0)     migraine  . Anxiety     Past Surgical History  Procedure Laterality Date  . Esophageal manometry [name] 09/09/2013    Procedure: ESOPHAGEAL MANOMETRY (EM);  Surgeon: Theda BelfastPatrick D Remedy Corporan, MD;  Location: [facility];  Service: Endoscopy;  Laterality: [name];  . Spinal fusion  1992  . Tonsillectomy    . Hand tendon surgery Left 2008    Reattachment  . Wisdom tooth extraction      History reviewed. No pertinent family history.  Social History:  reports that she has never smoked. She does not have any smokeless tobacco history on file. She reports that she drinks alcohol. She reports that she does not use illicit drugs.  Allergies:  Allergies  Allergen Reactions  . Amoxicillin Anaphylaxis  . Clindamycin/Lincomycin Anaphylaxis  . Septra [Sulfamethoxazole-Tmp Ds] Anaphylaxis    Medications:  Scheduled:  Continuous: . sodium chloride    . lactated ringers      No results found for this or any previous visit (from the past 24 hour(s)).   No results found.  ROS:  As stated above in the HPI otherwise negative.  Blood pressure 104/65, pulse 74, temperature 98.4 F (36.9 C), temperature source Oral, resp. rate 10, height 5' 3.5" (1.613 m), weight 136 lb (61.689 kg), last menstrual period 10/15/2013, SpO2 100.00%.    PE: Gen: NAD, Alert and Oriented HEENT:  [place]/AT, EOMI Neck: Supple, no LAD Lungs: CTA Bilaterally CV: RRR without M/G/R ABM: Soft, NTND, +BS Ext: No  C/C/E  Assessment/Plan: 1) GERD.  Plan: 1) EGD with BRAVO.  Theda Belfastatrick D Martia Dalby 11/21/2013, 1:16 PM

## 2013-11-21 NOTE — Transfer of Care (Signed)
Immediate Anesthesia Transfer of Care Note  Patient: Theresa Shelton  Procedure(s) Performed: Procedure(s): ESOPHAGOGASTRODUODENOSCOPY (EGD) ([name]) BRAVO PH STUDY ([name])  Patient Location: PACU  Anesthesia Type:MAC  Level of Consciousness: awake and alert   Airway & Oxygen Therapy: Patient Spontanous Breathing and Patient connected to nasal cannula oxygen  Post-op Assessment: Report given to PACU RN and Post -op Vital signs reviewed and stable  Post vital signs: Reviewed and stable  Complications: No apparent anesthesia complications

## 2013-11-21 NOTE — Anesthesia Postprocedure Evaluation (Signed)
  Anesthesia Post-op Note  Patient: Theresa Shelton  Procedure(s) Performed: Procedure(s) (LRB): ESOPHAGOGASTRODUODENOSCOPY (EGD) ([name]) BRAVO PH STUDY ([name])  Patient Location: PACU  Anesthesia Type: MAC  Level of Consciousness: awake and alert   Airway and Oxygen Therapy: Patient Spontanous Breathing  Post-op Pain: mild  Post-op Assessment: Post-op Vital signs reviewed, Patient's Cardiovascular Status Stable, Respiratory Function Stable, Patent Airway and No signs of Nausea or vomiting  Last Vitals:  Filed Vitals:   11/21/13 1425  BP:   Pulse: 92  Temp:   Resp: 10    Post-op Vital Signs: stable   Complications: No apparent anesthesia complications

## 2013-11-21 NOTE — Discharge Instructions (Signed)
Esophagogastroduodenoscopy °Care After °Refer to this sheet in the next few weeks. These instructions provide you with information on caring for yourself after your procedure. Your caregiver may also give you more specific instructions. Your treatment has been planned according to current medical practices, but problems sometimes occur. Call your caregiver if you have any problems or questions after your procedure.  °HOME CARE INSTRUCTIONS °· Do not eat or drink anything until the numbing medicine (local anesthetic) has worn off and your gag reflex has returned. You will know that the local anesthetic has worn off when you can swallow comfortably. °· Do not drive for 12 hours after the procedure or as directed by your caregiver. °· Only take medicines as directed by your caregiver. °SEEK MEDICAL CARE IF:  °· You cannot stop coughing. °· You are not urinating at all or less than usual. °SEEK IMMEDIATE MEDICAL CARE IF: °· You have difficulty swallowing. °· You cannot eat or drink. °· You have worsening throat or chest pain. °· You have dizziness, lightheadedness, or you faint. °· You have nausea or vomiting. °· You have chills. °· You have a fever. °· You have severe abdominal pain. °· You have black, tarry, or bloody stools. °Document Released: 06/20/2012 Document Reviewed: 06/20/2012 °ExitCare® Patient Information ©2014 [employer]. ° ° °Monitored Anesthesia Care  °Monitored anesthesia care is an anesthesia service for a medical procedure. Anesthesia is the loss of the ability to feel pain. It is produced by medications called anesthetics. It may affect a small area of your body (local anesthesia), a large area of your body (regional anesthesia), or your entire body (general anesthesia). The need for monitored anesthesia care depends your procedure, your condition, and the potential need for regional or general anesthesia. It is often provided during procedures where:  °· General anesthesia may be needed if there  are complications. This is because you need special care when you are under general anesthesia.   °· You will be under local or regional anesthesia. This is so that you are able to have higher levels of anesthesia if needed.   °· You will receive calming medications (sedatives). This is especially the case if sedatives are given to put you in a semi-conscious state of relaxation (deep sedation). This is because the amount of sedative needed to produce this state can be hard to predict. Too much of a sedative can produce general anesthesia. °Monitored anesthesia care is performed by one or more caregivers who have special training in all types of anesthesia. You will need to meet with these caregivers before your procedure. During this meeting, they will ask you about your medical history. They will also give you instructions to follow. (For example, you will need to stop eating and drinking before your procedure. You may also need to stop or change medications you are taking.) During your procedure, your caregivers will stay with you. They will:  °· Watch your condition. This includes watching you blood pressure, breathing, and level of pain.   °· Diagnose and treat problems that occur.   °· Give medications if they are needed. These may include calming medications (sedatives) and anesthetics.   °· Make sure you are comfortable.   °Having monitored anesthesia care does not necessarily mean that you will be under anesthesia. It does mean that your caregivers will be able to manage anesthesia if you need it or if it occurs. It also means that you will be able to have a different type of anesthesia than you are having if you need   it. When your procedure is complete, your caregivers will continue to watch your condition. They will make sure any medications wear off before you are allowed to go home.  °Document Released: 03/30/2005 Document Revised: 10/29/2012 Document Reviewed: 08/15/2012 °ExitCare® Patient Information  ©2014 [employer]. ° °

## 2013-11-22 ENCOUNTER — Encounter (HOSPITAL_COMMUNITY): Payer: Self-pay | Admitting: Gastroenterology

## 2013-11-27 ENCOUNTER — Encounter (INDEPENDENT_AMBULATORY_CARE_PROVIDER_SITE_OTHER): Payer: Medicaid Other | Admitting: Surgery

## 2013-12-03 ENCOUNTER — Telehealth (INDEPENDENT_AMBULATORY_CARE_PROVIDER_SITE_OTHER): Payer: Self-pay | Admitting: Surgery

## 2013-12-03 DIAGNOSIS — K30 Functional dyspepsia: Secondary | ICD-10-CM

## 2013-12-03 DIAGNOSIS — F419 Anxiety disorder, unspecified: Secondary | ICD-10-CM

## 2013-12-03 NOTE — Telephone Encounter (Signed)
PH probe study done.  Resorts normal.  Unfortunately this is not accurate information since the patient remained on proton pump inhibitors and did not come off, making a false negative result very likely.  The story somewhat confusing as the patient thinks she may be confused on the preprocedure recommendations and was told it was okay to stay on the proton pump inhibitors.  Endoscopy recalls the patient refusing to come off it, anxious about symptoms.  Discussed with my partners (Dr. Derrell Lollingamirez, Ileana LaddHoxworth, Newman, and Dr. Daphine DeutscherMartin).  I still feel strongly patient needs objective proof of reflux.  I called the patient.  Left a message.  She called back.  I called her again.  Now she is willing to consider repeat Bravo study off of GERD (PPI) medicines.  Discussed with her gastroenterologist Dr. Elnoria HowardHung.  He will help arrange this.  She needs to be off proton pump inhibitor for 7 days.  TUMS/Rolaidsokay until Bravo done and then 48 hours off all heartburn/reflux meds to prove reflux.  If she shows up again for the bravo study on PPIs, I would cancel it until she is compliant with the recommendations.  If BRAVO is completely normal off PPIs x 7days, that argues against doing an antireflux procedure.  I would be surprised if that is the case.  Another option is to consider upper GI with water siphon test.  Discussed with radiology.  That is not nearly as accurate for proof of GERD/reflux if the testis is normal.  But that would be a backup plan.  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  Another option is just to consider being on Reglan and see if that with the PPIs enough to control her symptoms.  She does not want to be on Reglan or any other gastric emptying medication.  She sounds like she wondered if only pyloroplasty need to done.  That is usually not my typical plan to do a pyloroplasty only.  Try to rule in/rule out reflux and go from there

## 2013-12-05 NOTE — Telephone Encounter (Signed)
Called pt to see if she had received her date for the repeat BRAVO study with Dr Elnoria HowardHung. The pt is scheduled for 12/26/13 for the BRAVO. I r/s her appt with Dr Michaell CowingGross from 5/29 to 6/18 b/c the pt is requesting the date for 6/18 so her mom can come to the visit. I advised pt that if the results are not back by the 18th we will have to r/s the appt again b/c we need the results in order to give her the full recommendations. The pt understands.

## 2013-12-10 ENCOUNTER — Other Ambulatory Visit: Payer: Self-pay | Admitting: Gastroenterology

## 2013-12-11 ENCOUNTER — Encounter (HOSPITAL_COMMUNITY): Payer: Self-pay | Admitting: Pharmacy Technician

## 2013-12-11 ENCOUNTER — Encounter (HOSPITAL_COMMUNITY): Payer: Self-pay | Admitting: *Deleted

## 2013-12-13 ENCOUNTER — Encounter (INDEPENDENT_AMBULATORY_CARE_PROVIDER_SITE_OTHER): Payer: Medicaid Other | Admitting: Surgery

## 2013-12-26 ENCOUNTER — Encounter (HOSPITAL_COMMUNITY): Payer: Medicaid Other | Admitting: Anesthesiology

## 2013-12-26 ENCOUNTER — Ambulatory Visit (HOSPITAL_COMMUNITY): Payer: Medicaid Other | Admitting: Anesthesiology

## 2013-12-26 ENCOUNTER — Encounter (HOSPITAL_COMMUNITY): Payer: Self-pay

## 2013-12-26 ENCOUNTER — Ambulatory Visit (HOSPITAL_COMMUNITY)
Admission: RE | Admit: 2013-12-26 | Discharge: 2013-12-26 | Disposition: A | Discharge status: Home / Self Care | Payer: Medicaid Other | Source: Ambulatory Visit | Attending: Gastroenterology | Admitting: Gastroenterology

## 2013-12-26 ENCOUNTER — Encounter (HOSPITAL_COMMUNITY)
Admission: RE | Disposition: A | Discharge status: Home / Self Care | Payer: Self-pay | Source: Ambulatory Visit | Attending: Gastroenterology

## 2013-12-26 DIAGNOSIS — K219 Gastro-esophageal reflux disease without esophagitis: Secondary | ICD-10-CM | POA: Insufficient documentation

## 2013-12-26 HISTORY — PX: BRAVO PH STUDY: SHX5421

## 2013-12-26 HISTORY — DX: Nausea with vomiting, unspecified: R11.2

## 2013-12-26 HISTORY — DX: Rosacea, unspecified: L71.9

## 2013-12-26 HISTORY — PX: ESOPHAGOGASTRODUODENOSCOPY (EGD) WITH PROPOFOL: SHX5813

## 2013-12-26 HISTORY — DX: Pure hypercholesterolemia, unspecified: E78.00

## 2013-12-26 HISTORY — DX: Other specified postprocedural states: Z98.890

## 2013-12-26 SURGERY — ESOPHAGOGASTRODUODENOSCOPY (EGD) WITH PROPOFOL
Anesthesia: Monitor Anesthesia Care

## 2013-12-26 MED ORDER — PROPOFOL 10 MG/ML IV BOLUS
INTRAVENOUS | Status: AC
  Filled 2013-12-26: qty 20

## 2013-12-26 MED ORDER — GLYCOPYRROLATE 0.2 MG/ML IJ SOLN
INTRAMUSCULAR | Status: DC | PRN
  Administered 2013-12-26 (×4): .05 mg via INTRAVENOUS

## 2013-12-26 MED ORDER — DEXAMETHASONE SODIUM PHOSPHATE 10 MG/ML IJ SOLN
INTRAMUSCULAR | Status: DC | PRN
  Administered 2013-12-26: 10 mg via INTRAVENOUS

## 2013-12-26 MED ORDER — DEXAMETHASONE SODIUM PHOSPHATE 10 MG/ML IJ SOLN
INTRAMUSCULAR | Status: AC
  Filled 2013-12-26: qty 1

## 2013-12-26 MED ORDER — GLYCOPYRROLATE 0.2 MG/ML IJ SOLN
INTRAMUSCULAR | Status: AC
  Filled 2013-12-26: qty 1

## 2013-12-26 MED ORDER — ESMOLOL HCL 10 MG/ML IV SOLN
INTRAVENOUS | Status: DC | PRN
  Administered 2013-12-26 (×4): 2 mg via INTRAVENOUS

## 2013-12-26 MED ORDER — PROPOFOL 10 MG/ML IV BOLUS
INTRAVENOUS | Status: AC
Start: 2013-12-26 — End: 2013-12-26
  Filled 2013-12-26: qty 20

## 2013-12-26 MED ORDER — METOCLOPRAMIDE HCL 5 MG/ML IJ SOLN
INTRAMUSCULAR | Status: DC | PRN
  Administered 2013-12-26: 10 mg via INTRAVENOUS

## 2013-12-26 MED ORDER — FENTANYL CITRATE 0.05 MG/ML IJ SOLN
INTRAMUSCULAR | Status: AC
  Filled 2013-12-26: qty 2

## 2013-12-26 MED ORDER — ONDANSETRON HCL 4 MG/2ML IJ SOLN
INTRAMUSCULAR | Status: DC | PRN
  Administered 2013-12-26: 4 mg via INTRAVENOUS

## 2013-12-26 MED ORDER — PROPOFOL 10 MG/ML IV BOLUS
INTRAVENOUS | Status: DC | PRN
  Administered 2013-12-26: 50 mg via INTRAVENOUS

## 2013-12-26 MED ORDER — METOCLOPRAMIDE HCL 5 MG/ML IJ SOLN
INTRAMUSCULAR | Status: AC
  Filled 2013-12-26: qty 2

## 2013-12-26 MED ORDER — ONDANSETRON HCL 4 MG/2ML IJ SOLN
INTRAMUSCULAR | Status: AC
  Filled 2013-12-26: qty 2

## 2013-12-26 MED ORDER — LACTATED RINGERS IV SOLN
INTRAVENOUS | Status: DC
  Administered 2013-12-26: 12:00:00 via INTRAVENOUS

## 2013-12-26 MED ORDER — SODIUM CHLORIDE 0.9 % IV SOLN: INTRAVENOUS | Status: DC

## 2013-12-26 MED ORDER — PROPOFOL INFUSION 10 MG/ML OPTIME
INTRAVENOUS | Status: DC | PRN
  Administered 2013-12-26: 160 ug/kg/min via INTRAVENOUS

## 2013-12-26 SURGICAL SUPPLY — 14 items

## 2013-12-26 NOTE — Transfer of Care (Signed)
Immediate Anesthesia Transfer of Care Note  Patient: Theresa Shelton  Procedure(s) Performed: Procedure(s): ESOPHAGOGASTRODUODENOSCOPY (EGD) WITH PROPOFOL ([name]) BRAVO PH STUDY ([name])  Patient Location: PACU  Anesthesia Type:MAC  Level of Consciousness: Patient easily awoken, sedated, comfortable, cooperative, following commands, responds to stimulation.   Airway & Oxygen Therapy: Patient spontaneously breathing, ventilating well, oxygen via simple oxygen mask.  Post-op Assessment: Report given to PACU RN, vital signs reviewed and stable, moving all extremities.   Post vital signs: Reviewed and stable.  Complications: No apparent anesthesia complications

## 2013-12-26 NOTE — Op Note (Signed)
[facility] [address] [place], [address]   OPERATIVE PROCEDURE REPORT  PATIENT: Theresa Shelton, Theresa Shelton  MR#: [id number] BIRTHDATE: [birth year]  GENDER: Female ENDOSCOPIST: Jeani Hawking, MD ASSISTANT:   Nilsa Nutting, Endo Technician and Claudie Revering, RN CGRN PROCEDURE DATE: 12/26/2013 PROCEDURE:   EGD, diagnostic ASA CLASS:   Class III INDICATIONS: GERD MEDICATIONS: MAC sedation, administered by CRNA TOPICAL ANESTHETIC:  DESCRIPTION OF PROCEDURE:   After the risks benefits and alternatives of the procedure were thoroughly explained, informed consent was obtained.  The PENTAX GASTOROSCOPE [id number]  endoscope was introduced through the mouth  and advanced to the second portion of the duodenum Without limitations.      The instrument was slowly withdrawn as the mucosa was fully examined.    FINDINGS:  The upper, middle and distal third of the esophagus were carefully inspected and no abnormalities were noted.  The z-line was well seen at the GEJ at 39 cm.  The BRAVO was deployed at 33 cm.  The endoscope was pushed into the fundus which was normal including a retroflexed view.  The antrum, gastric body, first and second part of the duodenum were unremarkable.          The scope was then withdrawn from the patient and the procedure terminated.  COMPLICATIONS: There were no complications. IMPRESSION: 1) Normal EGD.  BRAVO successfully deployed off of PPIs.  RECOMMENDATIONS: 1) Await BRAVO results.  _______________________________ eSignedJeani Hawking, MD 12/26/2013 1:21 PM

## 2013-12-26 NOTE — H&P (Signed)
   Theresa Shelton HPI: 38 year old female with GERD complaints.  She is now off of her omeprazole.  The patient reports that it was tough, but she did not have any nausea or vomiting.  Past Medical History  Diagnosis Date  . GERD (gastroesophageal reflux disease)   . Blood transfusion without reported diagnosis 23 yrs ago    self donation  . Arthritis   . Hip bursitis   . Scoliosis   . Headache(784.0)     migraine  . Anxiety   . Elevated cholesterol   . Rosacea   . Jaundice, neonatal   . PONV (postoperative nausea and vomiting) 11-21-13    Past Surgical History  Procedure Laterality Date  . Esophageal manometry [name] 09/09/2013    Procedure: ESOPHAGEAL MANOMETRY (EM);  Surgeon: Theda Belfast, MD;  Location: [facility];  Service: Endoscopy;  Laterality: [name];  . Spinal fusion  1992  . Tonsillectomy    . Hand tendon surgery Left 2008    Reattachment  . Wisdom tooth extraction    . Esophagogastroduodenoscopy [name] 11/21/2013    Procedure: ESOPHAGOGASTRODUODENOSCOPY (EGD);  Surgeon: Theda Belfast, MD;  Location: [facility];  Service: Endoscopy;  Laterality: [name];  . Bravo ph study [name] 11/21/2013    Procedure: BRAVO PH STUDY;  Surgeon: Theda Belfast, MD;  Location: [facility];  Service: Endoscopy;  Laterality: [name];    History reviewed. No pertinent family history.  Social History:  reports that she has never smoked. She has never used smokeless tobacco. She reports that she drinks alcohol. She reports that she does not use illicit drugs.  Allergies:  Allergies  Allergen Reactions  . Amoxicillin Anaphylaxis  . Clindamycin/Lincomycin Anaphylaxis  . Other Anaphylaxis    Mango  . Papaya Derivatives Anaphylaxis  . Septra [Sulfamethoxazole-Tmp Ds] Anaphylaxis    Medications:  Scheduled:  Continuous: . sodium chloride    . lactated ringers 20 mL/hr at 12/26/13 1136    No results found for this or any previous visit (from the past 24 hour(s)).   No results found.  ROS:   As stated above in the HPI otherwise negative.  Blood pressure 120/84, pulse 80, temperature 97.7 F (36.5 C), temperature source Oral, resp. rate 12, height 5' 3.5" (1.613 m), weight 138 lb 8 oz (62.823 kg), last menstrual period 12/25/2013, SpO2 99.00%.    PE: Gen: NAD, Alert and Oriented HEENT:  /AT, EOMI Neck: Supple, no LAD Lungs: CTA Bilaterally CV: RRR without M/G/R ABM: Soft, NTND, +BS Ext: No C/C/E  Assessment/Plan: 1) GERD.  Plan: 1) EGD with BRAVO off of PPI.  Theresa Shelton D 12/26/2013, 11:58 AM

## 2013-12-26 NOTE — Anesthesia Postprocedure Evaluation (Signed)
Anesthesia Post Note  Patient: Theresa Shelton  Procedure(s) Performed: Procedure(s) (LRB): ESOPHAGOGASTRODUODENOSCOPY (EGD) WITH PROPOFOL ([name]) BRAVO PH STUDY ([name])  Anesthesia type: MAC  Patient location: PACU  Post pain: Pain level controlled  Post assessment: Post-op Vital signs reviewed  Last Vitals:  Filed Vitals:   12/26/13 1340  BP: 117/90  Pulse: 86  Temp:   Resp: 12    Post vital signs: Reviewed  Level of consciousness: sedated  Complications: No apparent anesthesia complications

## 2013-12-26 NOTE — Anesthesia Preprocedure Evaluation (Addendum)
Anesthesia Evaluation  Patient identified by MRN, date of birth, ID band Patient awake    Reviewed: Allergy & Precautions, H&P , NPO status , Patient's Chart, lab work & pertinent test results  History of Anesthesia Complications (+) PONV  Airway Mallampati: II TM Distance: >3 FB Neck ROM: Full    Dental no notable dental hx. (+) Dental Advisory Given, Teeth Intact   Pulmonary neg pulmonary ROS,  breath sounds clear to auscultation  Pulmonary exam normal       Cardiovascular negative cardio ROS  Rhythm:Regular Rate:Normal     Neuro/Psych negative neurological ROS  negative psych ROS   GI/Hepatic negative GI ROS, Neg liver ROS, GERD-  Medicated and Controlled,  Endo/Other  negative endocrine ROS  Renal/GU negative Renal ROS  negative genitourinary   Musculoskeletal negative musculoskeletal ROS (+)   Abdominal   Peds negative pediatric ROS (+)  Hematology negative hematology ROS (+)   Anesthesia Other Findings Patient denies risk of pregnancy; not sexually active.  Reproductive/Obstetrics negative OB ROS                          Anesthesia Physical Anesthesia Plan  ASA: II  Anesthesia Plan: MAC   Post-op Pain Management:    Induction: Intravenous  Airway Management Planned: Nasal Cannula  Additional Equipment:   Intra-op Plan:   Post-operative Plan:   Informed Consent: I have reviewed the patients History and Physical, chart, labs and discussed the procedure including the risks, benefits and alternatives for the proposed anesthesia with the patient or authorized representative who has indicated his/her understanding and acceptance.   Dental advisory given  Plan Discussed with: CRNA  Anesthesia Plan Comments:         Anesthesia Quick Evaluation

## 2013-12-27 ENCOUNTER — Encounter (HOSPITAL_COMMUNITY): Payer: Self-pay | Admitting: Gastroenterology

## 2014-01-02 ENCOUNTER — Encounter (INDEPENDENT_AMBULATORY_CARE_PROVIDER_SITE_OTHER): Payer: Self-pay | Admitting: Surgery

## 2014-01-02 ENCOUNTER — Ambulatory Visit (INDEPENDENT_AMBULATORY_CARE_PROVIDER_SITE_OTHER): Payer: Medicaid Other | Admitting: Surgery

## 2014-01-02 VITALS — BP 118/76 | HR 81 | Temp 97.5°F | Ht 63.0 in | Wt 134.0 lb

## 2014-01-02 DIAGNOSIS — K30 Functional dyspepsia: Secondary | ICD-10-CM

## 2014-01-02 DIAGNOSIS — K3189 Other diseases of stomach and duodenum: Secondary | ICD-10-CM

## 2014-01-02 DIAGNOSIS — K219 Gastro-esophageal reflux disease without esophagitis: Secondary | ICD-10-CM

## 2014-01-02 DIAGNOSIS — R1013 Epigastric pain: Secondary | ICD-10-CM

## 2014-01-02 NOTE — Patient Instructions (Signed)
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  Please call our office at [phone] if you wish to schedule surgery or if you have further questions / concerns.   Gastroparesis  Gastroparesis is also called slowed stomach emptying (delayed gastric emptying). It is a condition in which the stomach takes too long to empty its contents. It often happens in people with diabetes.  CAUSES  Gastroparesis happens when nerves to the stomach are damaged or stop working. When the nerves are damaged, the muscles of the stomach and intestines do not work normally. The movement of food is slowed or stopped. High blood glucose (sugar) causes changes in nerves and can damage the blood vessels that carry oxygen and nutrients to the nerves. RISK FACTORS  Diabetes.  Post-viral syndromes.  Eating disorders (anorexia, bulimia).  Surgery on the stomach or vagus nerve.  Gastroesophageal reflux disease (rarely).  Smooth muscle disorders (amyloidosis, scleroderma).  Metabolic disorders, including hypothyroidism.  Parkinson disease. SYMPTOMS   Heartburn.  Feeling sick to your stomach (nausea).  Vomiting of undigested food.  An early feeling of fullness when eating.  Weight loss.  Abdominal bloating.  Erratic blood glucose levels.  Lack of appetite.  Gastroesophageal reflux.  Spasms of the stomach wall. Complications can include:  Bacterial overgrowth in stomach. Food stays in the stomach and can ferment and cause bacteria to grow.  Weight loss due to difficulty digesting and absorbing nutrients.  Vomiting.  Obstruction in the stomach. Undigested food can harden and cause nausea and vomiting.  Blood glucose fluctuations caused by inconsistent food  absorption. DIAGNOSIS  The diagnosis of gastroparesis is confirmed through one or more of the following tests:  Barium X-rays and scans. These tests look at how long it takes for food to move through the stomach.  Gastric manometry. This test measures electrical and muscular activity in the stomach. A thin tube is passed down the throat into the stomach. The tube contains a wire that takes measurements of the stomach's electrical and muscular activity as it digests liquids and solid food.  Endoscopy. This procedure is done with a long, thin tube called an endoscope. It is passed through the mouth and gently down the esophagus into the stomach. This tube helps the caregiver look at the lining of the stomach to check for any abnormalities.  Ultrasonography. This can rule out gallbladder disease or pancreatitis. This test will outline and define the shape of the gallbladder and pancreas. TREATMENT   Treatments may include:  Exercise.  Medicines to control nausea and vomiting.  Medicines to stimulate stomach muscles.  Changes in what and when you eat.  Having smaller meals more often.  Eating low-fiber forms of high-fiber foods, such as eating cooked vegetables instead of raw vegetables.  Eating low-fat foods.  Consuming liquids, which are easier to digest.  In severe cases, feeding tubes and intravenous (IV) feeding may be needed. It is important to note that in most cases, treatment does not cure gastroparesis. It is usually a lasting (chronic) condition. Treatment helps you manage the underlying condition so that you can be as healthy and comfortable as possible. Other treatments  A gastric neurostimulator has been developed to assist people with gastroparesis. The battery-operated device is surgically implanted. It emits mild electrical pulses to help improve stomach emptying  and to control nausea and vomiting.  The use of botulinum toxin has been shown to improve stomach  emptying by decreasing the prolonged contractions of the muscle between the stomach and the small intestine (pyloric sphincter). The benefits are temporary. SEEK MEDICAL CARE IF:   You have diabetes and you are having problems keeping your blood glucose in goal range.  You are having nausea, vomiting, bloating, or early feelings of fullness with eating.  Your symptoms do not change with a change in diet. Document Released: 07/04/2005 Document Revised: 10/29/2012 Document Reviewed: 12/11/2008 [facility] Patient Information 2015 [place], [place]. This information is not intended to replace advice given to you by your health care provider. Make sure you discuss any questions you have with your health care provider.  Gastroesophageal Reflux Disease, Adult Gastroesophageal reflux disease (GERD) happens when acid from your stomach flows up into the esophagus. When acid comes in contact with the esophagus, the acid causes soreness (inflammation) in the esophagus. Over time, GERD may create small holes (ulcers) in the lining of the esophagus. CAUSES   Increased body weight. This puts pressure on the stomach, making acid rise from the stomach into the esophagus.  Smoking. This increases acid production in the stomach.  Drinking alcohol. This causes decreased pressure in the lower esophageal sphincter (valve or ring of muscle between the esophagus and stomach), allowing acid from the stomach into the esophagus.  Late evening meals and a full stomach. This increases pressure and acid production in the stomach.  A malformed lower esophageal sphincter. Sometimes, no cause is found. SYMPTOMS   Burning pain in the lower part of the mid-chest behind the breastbone and in the mid-stomach area. This may occur twice a week or more often.  Trouble swallowing.  Sore throat.  Dry cough.  Asthma-like symptoms including chest tightness, shortness of breath, or wheezing. DIAGNOSIS  Your caregiver may be  able to diagnose GERD based on your symptoms. In some cases, X-rays and other tests may be done to check for complications or to check the condition of your stomach and esophagus. TREATMENT  Your caregiver may recommend over-the-counter or prescription medicines to help decrease acid production. Ask your caregiver before starting or adding any new medicines.  HOME CARE INSTRUCTIONS   Change the factors that you can control. Ask your caregiver for guidance concerning weight loss, quitting smoking, and alcohol consumption.  Avoid foods and drinks that make your symptoms worse, such as:  Caffeine or alcoholic drinks.  Chocolate.  Peppermint or mint flavorings.  Garlic and onions.  Spicy foods.  Citrus fruits, such as oranges, lemons, or limes.  Tomato-based foods such as sauce, chili, salsa, and pizza.  Fried and fatty foods.  Avoid lying down for the 3 hours prior to your bedtime or prior to taking a nap.  Eat small, frequent meals instead of large meals.  Wear loose-fitting clothing. Do not wear anything tight around your waist that causes pressure on your stomach.  Raise the head of your bed 6 to 8 inches with wood blocks to help you sleep. Extra pillows will not help.  Only take over-the-counter or prescription medicines for pain, discomfort, or fever as directed by your caregiver.  Do not take aspirin, ibuprofen, or other nonsteroidal anti-inflammatory drugs (NSAIDs). SEEK IMMEDIATE MEDICAL CARE IF:   You have pain in your arms, neck, jaw, teeth, or back.  Your pain increases or changes in intensity or duration.  You develop nausea, vomiting, or sweating (diaphoresis).  You develop shortness  of breath, or you faint.  Your vomit is green, yellow, black, or looks like coffee grounds or blood.  Your stool is red, bloody, or black. These symptoms could be signs of other problems, such as heart disease, gastric bleeding, or esophageal bleeding. MAKE SURE YOU:    Understand these instructions.  Will watch your condition.  Will get help right away if you are not doing well or get worse. Document Released: 04/13/2005 Document Revised: 09/26/2011 Document Reviewed: 01/21/2011 [facility] Patient Information 2015 [place]. This information is not intended to replace advice given to you by your health care provider. Make sure you discuss any questions you have with your health care provider.  EATING AFTER YOUR ESOPHAGEAL SURGERY (Stomach Fundoplication, Hiatal Hernia repair, Achalasia surgery, etc)  After your esophageal surgery, expect some sticking with swallowing over the next 1-2 months.    If food sticks when you eat, it is called "dysphagia".  This is due to swelling around your esophagus at the wrap & hiatal diaphragm repair.  It will gradually ease off over the next few months.  To help you through this temporary phase, we start you out on a pureed (blenderized) diet.  Your first meal in the hospital was thin liquids.  You should have been given a pureed diet by the time you left the hospital.  We ask patients to stay on a pureed diet for the first 2-3 weeks to avoid anything getting "stuck" near your recent surgery.  Don't be alarmed if your ability to swallow doesn't progress according to this plan.  Everyone is different and some diets can advance more or less quickly.     Some BASIC RULES to follow are:  Maintain an upright position whenever eating or drinking.  Take small bites - just a teaspoon size bite at a time.  Eat slowly.  It may also help to eat only one food at a time.  Consider nibbling through smaller, more frequent meals & avoid the urge to eat BIG meals  Do not push through feelings of fullness, nausea, or bloatedness  Do not mix solid foods and liquids in the same mouthful  Try not to "wash foods down" with large gulps of liquids. Avoid carbonated (bubbly/fizzy) drinks.  Understand that it will be hard to burp  and belch at first.  This gradually improves with time.  Expect to be more gassy/flatulent/bloated initially.  Walking will help you work through that.  Maalox/Gas-X can help as well.  Eat in a relaxed atmosphere & minimize distractions.  Avoid talking while eating.    Do not use straws.  Following each meal, sit in an upright position (90 degree angle) for 60 to 90 minutes.  Going for a short walk can help as well  If food does stick, don't panic.  Try to relax and let the food pass on its own.  Sipping WARM LIQUID such as strong hot black tea can also help slide it down.   Be gradual in changes & use common sense:  -If you easily tolerating a certain "level" of foods, advance to the next level gradually -If you are having trouble swallowing a particular food, then avoid it.   -If food is sticking when you advance your diet, go back to thinner previous diet (the lower LEVEL) for 1-2 days.  LEVEL 1 = PUREED DIET  Do for the first 2 WEEKS AFTER SURGERY  -Foods in this group are pureed or blenderized to a smooth, mashed potato-like consistency.  -  If necessary, the pureed foods can keep their shape with the addition of a thickening agent.   -Meat should be pureed to a smooth, pasty consistency.  Hot broth or gravy may be added to the pureed meat, approximately 1 oz. of liquid per 3 oz. serving of meat. -CAUTION:  If any foods do not puree into a smooth consistency, swallowing will be more difficult.  (For example, nuts or seeds sometimes do not blend well.)  Hot Foods Cold Foods  Pureed scrambled eggs and cheese Pureed cottage cheese  Baby cereals Thickened juices and nectars  Thinned cooked cereals (no lumps) Thickened milk or eggnog  Pureed [place] toast or pancakes Ensure  Mashed potatoes Ice cream  Pureed parsley, au gratin, scalloped potatoes, candied sweet potatoes Fruit or [place] ice, sherbet  Pureed buttered or alfredo noodles Plain yogurt  Pureed vegetables (no corn or peas)  Instant breakfast  Pureed soups and creamed soups Smooth pudding, mousse, custard  Pureed scalloped apples Whipped gelatin  Gravies Sugar, syrup, honey, jelly  Sauces, cheese, tomato, barbecue, white, creamed Cream  Any baby food Creamer  Alcohol in moderation (not beer or champagne) Margarine  Coffee or tea Mayonnaise   Ketchup, mustard   Apple sauce   SAMPLE MENU:  PUREED DIET Breakfast Lunch Dinner   Orange juice, 1/2 cup  Cream of wheat, 1/2 cup  Pineapple juice, 1/2 cup  Pureed [place], barley soup, 3/4 cup  Pureed Hawaiian chicken, 3 oz   Scrambled eggs, mashed or blended with cheese, 1/2 cup  Tea or coffee, 1 cup   Whole milk, 1 cup   Non-dairy creamer, 2 Tbsp.  Mashed potatoes, 1/2 cup  Pureed cooled broccoli, 1/2 cup  Apple sauce, 1/2 cup  Coffee or tea  Mashed potatoes, 1/2 cup  Pureed spinach, 1/2 cup  Frozen yogurt, 1/2 cup  Tea or coffee      LEVEL 2 = SOFT DIET  After your first 2 weeks, you can advance to a soft diet.   Keep on this diet until everything goes down easily.  Hot Foods Cold Foods  White fish Cottage cheese  Stuffed fish Junior baby fruit  Baby food meals Semi thickened juices  Minced soft cooked, scrambled, poached eggs nectars  Souffle & omelets Ripe mashed bananas  Cooked cereals Canned fruit, pineapple sauce, milk  potatoes Milkshake  Buttered or Alfredo noodles Custard  Cooked cooled vegetable Puddings, including tapioca  Sherbet Yogurt  Vegetable soup or alphabet soup Fruit ice, [place] ice  Gravies Whipped gelatin  Sugar, syrup, honey, jelly Junior baby desserts  Sauces:  Cheese, creamed, barbecue, tomato, white Cream  Coffee or tea Margarine   SAMPLE MENU:  LEVEL 2 Breakfast Lunch Dinner   Orange juice, 1/2 cup  Oatmeal, 1/2 cup  Scrambled eggs with cheese, 1/2 cup  Decaffeinated tea, 1 cup  Whole milk, 1 cup  Non-dairy creamer, 2 Tbsp  Pineapple juice, 1/2 cup  Minced beef, 3 oz  Gravy, 2  Tbsp  Mashed potatoes, 1/2 cup  Minced fresh broccoli, 1/2 cup  Applesauce, 1/2 cup  Coffee, 1 cup  [place], barley soup, 3/4 cup  Minced Hawaiian chicken, 3 oz  Mashed potatoes, 1/2 cup  Cooked spinach, 1/2 cup  Frozen yogurt, 1/2 cup  Non-dairy creamer, 2 Tbsp      LEVEL 3 = CHOPPED DIET  -After all the foods in level 2 (soft diet) are passing through well you should advance up to more chopped foods.  -It is still important to  cut these foods into small pieces and eat slowly.  Hot Foods Cold Foods  Poultry Cottage cheese  Chopped Swedish meatballs Yogurt  Meat salads (ground or flaked meat) Milk  Flaked fish (tuna) Milkshakes  Poached or scrambled eggs Soft, cold, dry cereal  Souffles and omelets Fruit juices or nectars  Cooked cereals Chopped canned fruit  Chopped [place] toast or pancakes Canned fruit cocktail  Noodles or pasta (no rice) Pudding, mousse, custard  Cooked vegetables (no frozen peas, corn, or mixed vegetables) Green salad  Canned small sweet peas Ice cream  Creamed soup or vegetable soup Fruit ice, [place] ice  Pureed vegetable soup or alphabet soup Non-dairy creamer  Ground scalloped apples Margarine  Gravies Mayonnaise  Sauces:  Cheese, creamed, barbecue, tomato, white Ketchup  Coffee or tea Mustard   SAMPLE MENU:  LEVEL 3 Breakfast Lunch Dinner   Orange juice, 1/2 cup  Oatmeal, 1/2 cup  Scrambled eggs with cheese, 1/2 cup  Decaffeinated tea, 1 cup  Whole milk, 1 cup  Non-dairy creamer, 2 Tbsp  Ketchup, 1 Tbsp  Margarine, 1 tsp  Salt, 1/4 tsp  Sugar, 2 tsp  Pineapple juice, 1/2 cup  Ground beef, 3 oz  Gravy, 2 Tbsp  Mashed potatoes, 1/2 cup  Cooked spinach, 1/2 cup  Applesauce, 1/2 cup  Decaffeinated coffee  Whole milk  Non-dairy creamer, 2 Tbsp  Margarine, 1 tsp  Salt, 1/4 tsp  Pureed [place], barley soup, 3/4 cup  Barbecue chicken, 3 oz  Mashed potatoes, 1/2 cup  Ground fresh broccoli, 1/2  cup  Frozen yogurt, 1/2 cup  Decaffeinated tea, 1 cup  Non-dairy creamer, 2 Tbsp  Margarine, 1 tsp  Salt, 1/4 tsp  Sugar, 1 tsp    LEVEL 4:  REGULAR FOODS  -Foods in this group are soft, moist, regularly textured foods.   -This level includes meat and breads, which tend to be the hardest things to swallow.   -Eat very slowly, chew well and continue to avoid carbonated drinks. -most people are at this level in 4-6 weeks  Hot Foods Cold Foods  Baked fish or skinned Soft cheeses - cottage cheese  Souffles and omelets Cream cheese  Eggs Yogurt  Stuffed shells Milk  Spaghetti with meat sauce Milkshakes  Cooked cereal Cold dry cereals (no nuts, dried fruit, coconut)  [place] toast or pancakes Crackers  Buttered toast Fruit juices or nectars  Noodles or pasta (no rice) Canned fruit  Potatoes (all types) Ripe bananas  Soft, cooked vegetables (no corn, lima, or baked beans) Peeled, ripe, fresh fruit  Creamed soups or vegetable soup Cakes (no nuts, dried fruit, coconut)  Canned chicken noodle soup Plain doughnuts  Gravies Ice cream  Bacon dressing Pudding, mousse, custard  Sauces:  Cheese, creamed, barbecue, tomato, white Fruit ice, [place] ice, sherbet  Decaffeinated tea or coffee Whipped gelatin  Pork chops Regular gelatin   Canned fruited gelatin molds   Sugar, syrup, honey, jam, jelly   Cream   Non-dairy   Margarine   Oil   Mayonnaise   Ketchup   Mustard    If you have any questions please call our office at [facility]: [phone].  Dumping Syndrome Rapid gastric emptying, or dumping syndrome, happens when the lower end of the small intestine fills too quickly with undigested food from the stomach. "Early" dumping begins during or right after a meal. "Late" dumping happens 1 to 3 hours after eating. Many people have both types. CAUSES   Certain types of stomach surgery  that allow the stomach to empty rapidly are the main cause of dumping  syndrome.  Patients with Zollinger-Ellison syndrome may also have dumping syndrome. Zollinger-Ellison syndrome is a rare disorder involving extreme peptic ulcer disease and gastrin-secreting tumors in the pancreas. SYMPTOMS Early dumping  Nausea.  Bloating.  Dizziness.  Vomiting.  Cramping.  Diarrhea.  Fatigue. Late dumping  Low blood sugar (hypoglycemia).  Weakness.  Sweating.  Dizziness. DIAGNOSIS  Doctors diagnose dumping syndrome primarily on the basis of symptoms in patients who have had gastric surgery that causes the syndrome. Tests may be needed to exclude other conditions that have similar symptoms. TREATMENT   Treatment includes changes in eating habits and medicine.  People who have dumping syndrome need to eat several small meals a day that are low in carbohydrates and should drink liquids between meals, not with them.  People with severe cases may be prescribed medicine to slow their digestion.  Caregivers may at times recommend surgery to help correct the problem. Document Released: 05/27/2004 Document Revised: 09/26/2011 Document Reviewed: 05/29/2008 [facility] Patient Information 2015 [place], [place]. This information is not intended to replace advice given to you by your health care provider. Make sure you discuss any questions you have with your health care provider.

## 2014-01-02 NOTE — Progress Notes (Signed)
Subjective:     Patient ID: Theresa Shelton, female   DOB: 09/21/1975, 38 y.o.   MRN: [id number]  HPI   Review of Systems     Objective:   Physical Exam          *            Subjective:     Patient ID: Theresa Shelton, female   DOB: [birth year], 38 y.o.   MRN: [id number]  HPI  Note: This dictation was prepared with Dragon/digital dictation along with [facility] technology. Any transcriptional errors that result from this process are unintentional.       Theresa Shelton  10/20/1975 [id number]  Patient Care Team: Theresa Pile. Dareen Piano, FNP as PCP - General (Nurse Practitioner) Theresa Belfast, MD as Consulting Physician (Gastroenterology) Theresa Sportsman, MD as Consulting Physician (General Surgery)  This patient is a 38 y.o.female who presents today for surgical evaluation at the request of Dr. Elnoria Shelton.   Reason for visit: Worsening GERD intractable to medical management  Pleasant white female does struggle with heartburn and reflux for 15 years.  She comes in today with her very active 66-year-old son.  She had chronic burning to the back of her throat.  Poor food tolerance.  Ice chips/water.  Saw commercial concern for reflux.  Mentioned her primary care physician.  Started on an acid medicines.  She has been on most H2 blockers and proton pump inhibitors since that time.  Usually a medicine will help for 6 months.  Then things will dwindle in effectiveness.  Medicines will be increased or switched.  Then that seems to help for 3-6 motnhs.  She is made major diet modifications.  She stopped soda, fast food, caffeine, chocolate.  She is on a more organic Paleo type diet.  Those have helped.  She is intentionally lost 40 pounds of weight.  However Things have progressive [name] worsened and now the medicines, weight loss, and diet changes have Stopped being in a more effective and she still struggles appear  She still struggles with reflux.  Could not tolerate lying flat or bending  over unless has her medicines.  Symptoms get markedly worse if she misses a dose.  She has had a couple his episodes of severe epigastric and chest pain.  Felt not to be cardiac or pulmonary etiology.  Normally hikes with her family 4-6 miles without problems.  3 bowel movements a day.  No personal nor family history of GI/colon cancer, inflammatory bowel disease, irritable bowel syndrome, allergy such as Celiac Sprue, dietary/dairy problems, colitis, ulcers nor gastritis.  No recent sick contacts/gastroenteritis.  No travel outside the country.  No changes in diet.  No dysphagia to solids or liquids.  No hematochezia, hematemesis, coffee ground emesis.  No evidence of prior gastric/peptic ulceration.  She saw gastroenterology.  Dr. Elnoria Shelton performed endoscopy 2 years ago.  She recalls being told it was underwhelming.  I do not have the report in front of me.  Not able to get that record today.  Dr. Elnoria Shelton saw the patient again this year.  On the significant acid suppression.  Still struggling.  Manometry performed over concerns of crampy chest pain perhaps being esophageal spasms.  That is normal.  Surgical consultation requested to see if benefit of fundoplication for better control of significant GERD  I ordered a gastric emptying study.  That showed delayed gastric emptying rather profound.  She does confessed to having bloating and early satiety.  Claims to have a bowel  movement every day with a very high-fiber diet.  She did redo the bravo study since the first one was on medicines.  Did not have orbital symptoms on PPIs which surprised her but the family had episodes of reflux.  She comes today with many questions  Patient Active Problem List   Diagnosis Date Noted  . Delayed gastric emptying 12/03/2013  . Anxiety   . GERD (gastroesophageal reflux disease) 09/27/2013    Past Medical History  Diagnosis Date  . GERD (gastroesophageal reflux disease)   . Blood transfusion without reported diagnosis 23  yrs ago    self donation  . Arthritis   . Hip bursitis   . Scoliosis   . Headache(784.0)     migraine  . Anxiety   . Elevated cholesterol   . Rosacea   . Jaundice, neonatal   . PONV (postoperative nausea and vomiting) 11-21-13    Past Surgical History  Procedure Laterality Date  . Esophageal manometry [name] 09/09/2013    Procedure: ESOPHAGEAL MANOMETRY (EM);  Surgeon: Theresa Belfast, MD;  Location: [facility];  Service: Endoscopy;  Laterality: [name];  . Spinal fusion  1992  . Tonsillectomy    . Hand tendon surgery Left 2008    Reattachment  . Wisdom tooth extraction    . Esophagogastroduodenoscopy [name] 11/21/2013    Procedure: ESOPHAGOGASTRODUODENOSCOPY (EGD);  Surgeon: Theresa Belfast, MD;  Location: [facility];  Service: Endoscopy;  Laterality: [name];  . Bravo ph study [name] 11/21/2013    Procedure: BRAVO PH STUDY;  Surgeon: Theresa Belfast, MD;  Location: [facility];  Service: Endoscopy;  Laterality: [name];  . Esophagogastroduodenoscopy (egd) with propofol [name] 12/26/2013    Procedure: ESOPHAGOGASTRODUODENOSCOPY (EGD) WITH PROPOFOL;  Surgeon: Theresa Belfast, MD;  Location: [facility];  Service: Endoscopy;  Laterality: [name];  . Bravo ph study [name] 12/26/2013    Procedure: BRAVO PH STUDY;  Surgeon: Theresa Belfast, MD;  Location: [facility];  Service: Endoscopy;  Laterality: [name];    History   Social History  . Marital Status: Married    Spouse Name: [name]    Number of Children: [name]  . Years of Education: [name]   Occupational History  . Not on file.   Social History Main Topics  . Smoking status: Never Smoker   . Smokeless tobacco: Never Used  . Alcohol Use: Yes     Comment: 2-3 glasses of wine per month  . Drug Use: No  . Sexual Activity: Not on file   Other Topics Concern  . Not on file   Social History Narrative  . No narrative on file    History reviewed. No pertinent family history.  Current Outpatient Prescriptions  Medication Sig Dispense Refill  . amitriptyline  (ELAVIL) 25 MG tablet Take 25 mg by mouth at bedtime as needed (takes at bedtime if she is getting a migraine).      Marland Kitchen diclofenac (FLECTOR) 1.3 % PTCH Place 1 patch onto the skin 2 (two) times daily as needed (pain).      Marland Kitchen EPINEPHrine (EPIPEN) 0.3 mg/0.3 mL IJ SOAJ injection Inject 0.3 mg into the muscle as needed (Allergic reaction).      . hydrocortisone cream 1 % Apply 1 application topically as needed for itching (Rectal).      . meloxicam (MOBIC) 7.5 MG tablet Take 7.5 mg by mouth 2 (two) times daily.       Marland Kitchen omeprazole (PRILOSEC) 40 MG capsule Take 40 mg by mouth daily. Patient told  by dr gross to stop this medication 5 days prior to egd      . PARoxetine (PAXIL) 10 MG tablet Take 10 mg by mouth at bedtime.      . tapentadol (NUCYNTA) 50 MG TABS tablet Take 50 mg by mouth every 6 (six) hours as needed for moderate pain.       No current facility-administered medications for this visit.     Allergies  Allergen Reactions  . Amoxicillin Anaphylaxis  . Clindamycin/Lincomycin Anaphylaxis  . Other Anaphylaxis    Mango  . Papaya Derivatives Anaphylaxis  . Septra [Sulfamethoxazole-Tmp Ds] Anaphylaxis    BP 118/76  Pulse 81  Temp(Src) 97.5 F (36.4 C)  Ht 5\' 3"  (1.6 m)  Wt 134 lb (60.782 kg)  BMI 23.74 kg/m2  LMP 12/25/2013  No results found.   Review of Systems  Constitutional: Negative for fever, chills, diaphoresis, appetite change and fatigue.  HENT: Negative for ear discharge, ear pain, sore throat and trouble swallowing.   Eyes: Negative for photophobia, discharge and visual disturbance.  Respiratory: Negative for cough, choking, chest tightness, shortness of breath, wheezing and stridor.   Cardiovascular: Positive for chest pain. Negative for palpitations and leg swelling.  Gastrointestinal: Positive for abdominal pain. Negative for nausea, vomiting, constipation, blood in stool, abdominal distention, anal bleeding and rectal pain.  Endocrine: Negative for cold  intolerance and heat intolerance.  Genitourinary: Negative for dysuria, frequency, decreased urine volume, difficulty urinating and pelvic pain.  Musculoskeletal: Positive for arthralgias and back pain. Negative for gait problem, joint swelling, neck pain and neck stiffness.  Skin: Negative for color change, pallor and rash.  Allergic/Immunologic: Negative for environmental allergies, food allergies and immunocompromised state.  Neurological: Negative for dizziness, speech difficulty, weakness and numbness.  Hematological: Negative for adenopathy.  Psychiatric/Behavioral: Negative for confusion and agitation. The patient is not nervous/anxious.        Objective:   Physical Exam  Constitutional: She is oriented to person, place, and time. She appears well-developed and well-nourished. No distress.  HENT:  Head: Normocephalic.  Mouth/Throat: Oropharynx is clear and moist. No oropharyngeal exudate.  Eyes: Conjunctivae and EOM are normal. Pupils are equal, round, and reactive to light. No scleral icterus.  Neck: Normal range of motion. Neck supple. No tracheal deviation present.  Cardiovascular: Normal rate, regular rhythm and intact distal pulses.   Pulmonary/Chest: Effort normal and breath sounds normal. No stridor. No respiratory distress. She exhibits no tenderness.  Abdominal: Soft. She exhibits no distension and no mass. There is no tenderness. Hernia confirmed negative in the right inguinal area and confirmed negative in the left inguinal area.  Genitourinary: No vaginal discharge found.  Musculoskeletal: Normal range of motion. She exhibits no tenderness.       Right elbow: She exhibits normal range of motion.       Left elbow: She exhibits normal range of motion.       Right wrist: She exhibits normal range of motion.       Left wrist: She exhibits normal range of motion.       Right hand: Normal strength noted.       Left hand: Normal strength noted.  Lymphadenopathy:       Head  (right side): No posterior auricular adenopathy present.       Head (left side): No posterior auricular adenopathy present.    She has no cervical adenopathy.    She has no axillary adenopathy.       Right: No inguinal  adenopathy present.       Left: No inguinal adenopathy present.  Neurological: She is alert and oriented to person, place, and time. No cranial nerve deficit. She exhibits normal muscle tone. Coordination normal.  Skin: Skin is warm and dry. No rash noted. She is not diaphoretic. No erythema.  Psychiatric: She has a normal mood and affect. Judgment and thought content normal. Her speech is not tangential and not slurred. She is hyperactive. She is not agitated and not aggressive. Cognition and memory are normal.  Somewhat restless.  Not manic but with some pressured speech.  Pleasant.       Assessment:     GERD by history and Bravo study.  [place]ter 20  Profound delayed gastric emptying.    Plan:     I think we have more objective proved to recommend Nissen fundoplication and pyloroplasty.  She is interested in proceeding.  I did discuss what fundoplication would entail:  The anatomy & physiology of the foregut and anti-reflux mechanism was discussed.  The pathophysiology of GERD was discussed.  Natural history risks without surgery was discussed.   The patient's symptoms are not adequately controlled by medicines and other non-operative treatments.  I feel the risks of no intervention will lead to serious problems that outweigh the operative risks; therefore, I recommended surgery to  rebuild the anti-reflux valve and control reflux better.  Need for a thorough workup to rule out the differential diagnosis and plan treatment was explained.  I explained laparoscopic techniques with possible need for an open approach.  Given the profound gastroparesis, recommended pyloroplasty.  Risks such as bleeding, infection, abscess, leak, need for further treatment, heart attack, death,  and other risks were discussed.   I noted a good likelihood this will help address the problem.  Goals of post-operative recovery were discussed as well.  Possibility that this will not correct all symptoms was explained.  Post-operative dysphagia, need for short-term liquid & pureed diet, inability to vomit, possibility of wrap slippage or herniation, possible need for medicines to help control symptoms in addition to surgery were discussed.  Risk of dumping syndrome with high fat/sugary diets as well.  We will work to minimize complications.   Educational handouts further explaining the pathology, treatment options, and dysphagia diet was given as well.  Questions were answered.  The patient expresses understanding & wishes to proceed with surgery.

## 2014-02-19 ENCOUNTER — Telehealth (INDEPENDENT_AMBULATORY_CARE_PROVIDER_SITE_OTHER): Payer: Self-pay

## 2014-02-19 NOTE — Telephone Encounter (Signed)
LMOM returned pt's call just call me back.

## 2014-02-19 NOTE — Telephone Encounter (Signed)
Message copied by Ethlyn GallerySPILLERS, Aarohi Redditt on Wed Feb 19, 2014 10:54 AM ------      Message from: Norva PavlovALLEY, TERRI      Created: Tue Feb 18, 2014  1:09 PM      Contact: [phone]       Pt called and has some questions about her upcoming surgery pls call when you can, ty TT ------

## 2014-03-07 ENCOUNTER — Encounter (HOSPITAL_COMMUNITY): Payer: Self-pay | Admitting: Pharmacy Technician

## 2014-03-07 NOTE — Patient Instructions (Addendum)
20 Theresa Shelton  03/07/2014   Your procedure is scheduled on: Tuesday September 1st, 2015  [facility] and follow signs to  Short Stay Center at 530 AM.  Call this number if you have problems the morning of surgery [phone]   Remember:  Do not eat food or drink liquids :After Midnight.     Take these medicines the morning of surgery with A SIP OF WATER: omeprazole, zyrtec                               You may not have any metal on your body including hair pins and piercings  Do not wear jewelry, make-up, lotions, powders, or deodorant.   Men may shave face and neck.  Do not bring valuables to the hospital. [facility] IS NOT RESPONSIBLE FOR VALUABLES.  Contacts, dentures or bridgework may not be worn into surgery.  Leave suitcase in the car. After surgery it may be brought to your room.  For patients admitted to the hospital, checkout time is 11:00 AM the day of discharge.   ________________________________________________________________________  [facility] - Preparing for [facility] Before surgery, you can play an important role.  Because skin is not sterile, your skin needs to be as free of germs as possible.  You can reduce the number of germs on your skin by washing with CHG (chlorahexidine gluconate) soap before surgery.  CHG is an antiseptic cleaner which kills germs and bonds with the skin to continue killing germs even after washing. Please DO NOT use if you have an allergy to CHG or antibacterial soaps.  If your skin becomes reddened/irritated stop using the CHG and inform your nurse when you arrive at Short Stay. Do not shave (including legs and underarms) for at least 48 hours prior to the first CHG shower.  You may shave your face/neck. Please follow these instructions carefully:  1.  Shower with CHG Soap the night before surgery and the  morning of Surgery.  2.  If you choose to wash your hair, wash your hair first as usual with your   normal  shampoo.  3.  After you shampoo, rinse your hair and body thoroughly to remove the  shampoo.                           4.  Use CHG as you would any other liquid soap.  You can apply chg directly  to the skin and wash                       Gently with a scrungie or clean washcloth.  5.  Apply the CHG Soap to your body ONLY FROM THE NECK DOWN.   Do not use on face/ open                           Wound or open sores. Avoid contact with eyes, ears mouth and genitals (private parts).                       Wash face,  Genitals (private parts) with your normal soap.             6.  Wash thoroughly, paying special attention to the area where your surgery  will be performed.  7.  Thoroughly  rinse your body with warm water from the neck down.  8.  DO NOT shower/wash with your normal soap after using and rinsing off  the CHG Soap.                9.  Pat yourself dry with a clean towel.            10.  Wear clean pajamas.            11.  Place clean sheets on your bed the night of your first shower and do not  sleep with pets. Day of Surgery : Do not apply any lotions/deodorants the morning of surgery.  Please wear clean clothes to the hospital/surgery center.  FAILURE TO FOLLOW THESE INSTRUCTIONS MAY RESULT IN THE CANCELLATION OF YOUR SURGERY PATIENT SIGNATURE_________________________________  NURSE SIGNATURE__________________________________  ________________________________________________________________________

## 2014-03-12 ENCOUNTER — Encounter (HOSPITAL_COMMUNITY): Payer: Self-pay

## 2014-03-12 ENCOUNTER — Encounter (HOSPITAL_COMMUNITY)
Admission: RE | Admit: 2014-03-12 | Discharge: 2014-03-12 | Disposition: A | Discharge status: Home / Self Care | Payer: Medicaid Other | Source: Ambulatory Visit | Attending: Surgery | Admitting: Surgery

## 2014-03-12 DIAGNOSIS — K3184 Gastroparesis: Secondary | ICD-10-CM | POA: Diagnosis present

## 2014-03-12 DIAGNOSIS — K219 Gastro-esophageal reflux disease without esophagitis: Secondary | ICD-10-CM | POA: Diagnosis present

## 2014-03-12 DIAGNOSIS — Z01818 Encounter for other preprocedural examination: Secondary | ICD-10-CM | POA: Diagnosis present

## 2014-03-12 HISTORY — DX: Gastroparesis: K31.84

## 2014-03-12 HISTORY — DX: Family history of other specified conditions: Z84.89

## 2014-03-12 LAB — CBC
HEMATOCRIT: 37.9 % (ref 36.0–46.0)
Hemoglobin: 12.9 g/dL (ref 12.0–15.0)
MCH: 30.3 pg (ref 26.0–34.0)
MCHC: 34 g/dL (ref 30.0–36.0)
MCV: 89 fL (ref 78.0–100.0)
PLATELETS: 242 10*3/uL (ref 150–400)
RBC: 4.26 MIL/uL (ref 3.87–5.11)
RDW: 12.3 % (ref 11.5–15.5)
WBC: 5.8 10*3/uL (ref 4.0–10.5)

## 2014-03-12 LAB — HCG, SERUM, QUALITATIVE: Preg, Serum: NEGATIVE

## 2014-03-17 MED ORDER — VANCOMYCIN HCL 10 G IV SOLR
1500.0000 mg | INTRAVENOUS | Status: AC
  Administered 2014-03-18: 1000 mg via INTRAVENOUS
  Filled 2014-03-17: qty 1500

## 2014-03-17 NOTE — Anesthesia Preprocedure Evaluation (Addendum)
Anesthesia Evaluation  Patient identified by MRN, date of birth, ID band Patient awake    Reviewed: Allergy & Precautions, H&P , NPO status , Patient's Chart, lab work & pertinent test results  Airway Mallampati: II TM Distance: >3 FB Neck ROM: Full    Dental no notable dental hx.    Pulmonary neg pulmonary ROS,  breath sounds clear to auscultation  Pulmonary exam normal       Cardiovascular negative cardio ROS  Rhythm:Regular Rate:Normal     Neuro/Psych negative neurological ROS  negative psych ROS   GI/Hepatic Neg liver ROS, GERD-  Medicated,  Endo/Other  negative endocrine ROS  Renal/GU negative Renal ROS  negative genitourinary   Musculoskeletal negative musculoskeletal ROS (+)   Abdominal   Peds negative pediatric ROS (+)  Hematology negative hematology ROS (+)   Anesthesia Other Findings   Reproductive/Obstetrics negative OB ROS                          Anesthesia Physical Anesthesia Plan  ASA: II  Anesthesia Plan: General   Post-op Pain Management:    Induction: Intravenous and Rapid sequence  Airway Management Planned:   Additional Equipment:   Intra-op Plan:   Post-operative Plan: Extubation in OR  Informed Consent: I have reviewed the patients History and Physical, chart, labs and discussed the procedure including the risks, benefits and alternatives for the proposed anesthesia with the patient or authorized representative who has indicated his/her understanding and acceptance.   Dental advisory given  Plan Discussed with: CRNA and Surgeon  Anesthesia Plan Comments:         Anesthesia Quick Evaluation

## 2014-03-18 ENCOUNTER — Encounter (HOSPITAL_COMMUNITY): Payer: Medicaid Other | Admitting: Anesthesiology

## 2014-03-18 ENCOUNTER — Encounter (HOSPITAL_COMMUNITY)
Admission: RE | Disposition: A | Discharge status: Home / Self Care | Payer: Self-pay | Source: Ambulatory Visit | Attending: Surgery

## 2014-03-18 ENCOUNTER — Encounter (HOSPITAL_COMMUNITY): Payer: Self-pay | Admitting: *Deleted

## 2014-03-18 ENCOUNTER — Ambulatory Visit (HOSPITAL_COMMUNITY): Payer: Medicaid Other | Admitting: Anesthesiology

## 2014-03-18 ENCOUNTER — Observation Stay (HOSPITAL_COMMUNITY)
Admission: RE | Admit: 2014-03-18 | Discharge: 2014-03-21 | Disposition: A | Discharge status: Home / Self Care | Payer: Medicaid Other | Source: Ambulatory Visit | Attending: Surgery | Admitting: Surgery

## 2014-03-18 DIAGNOSIS — G43809 Other migraine, not intractable, without status migrainosus: Secondary | ICD-10-CM | POA: Diagnosis not present

## 2014-03-18 DIAGNOSIS — Z888 Allergy status to other drugs, medicaments and biological substances status: Secondary | ICD-10-CM | POA: Diagnosis not present

## 2014-03-18 DIAGNOSIS — K208 Other esophagitis without bleeding: Secondary | ICD-10-CM | POA: Diagnosis not present

## 2014-03-18 DIAGNOSIS — K3189 Other diseases of stomach and duodenum: Secondary | ICD-10-CM | POA: Diagnosis not present

## 2014-03-18 DIAGNOSIS — K2100 Gastro-esophageal reflux disease with esophagitis, without bleeding: Secondary | ICD-10-CM

## 2014-03-18 DIAGNOSIS — R1013 Epigastric pain: Secondary | ICD-10-CM

## 2014-03-18 DIAGNOSIS — Z91018 Allergy to other foods: Secondary | ICD-10-CM | POA: Insufficient documentation

## 2014-03-18 DIAGNOSIS — J9859 Other diseases of mediastinum, not elsewhere classified: Secondary | ICD-10-CM | POA: Insufficient documentation

## 2014-03-18 DIAGNOSIS — M7070 Other bursitis of hip, unspecified hip: Secondary | ICD-10-CM | POA: Diagnosis present

## 2014-03-18 DIAGNOSIS — K449 Diaphragmatic hernia without obstruction or gangrene: Secondary | ICD-10-CM | POA: Diagnosis not present

## 2014-03-18 DIAGNOSIS — F419 Anxiety disorder, unspecified: Secondary | ICD-10-CM | POA: Diagnosis present

## 2014-03-18 DIAGNOSIS — K30 Functional dyspepsia: Secondary | ICD-10-CM | POA: Diagnosis present

## 2014-03-18 DIAGNOSIS — Z881 Allergy status to other antibiotic agents status: Secondary | ICD-10-CM | POA: Diagnosis not present

## 2014-03-18 DIAGNOSIS — Z882 Allergy status to sulfonamides status: Secondary | ICD-10-CM | POA: Diagnosis not present

## 2014-03-18 DIAGNOSIS — L719 Rosacea, unspecified: Secondary | ICD-10-CM | POA: Insufficient documentation

## 2014-03-18 DIAGNOSIS — M4120 Other idiopathic scoliosis, site unspecified: Secondary | ICD-10-CM | POA: Diagnosis not present

## 2014-03-18 DIAGNOSIS — M129 Arthropathy, unspecified: Secondary | ICD-10-CM | POA: Diagnosis not present

## 2014-03-18 DIAGNOSIS — K219 Gastro-esophageal reflux disease without esophagitis: Secondary | ICD-10-CM | POA: Diagnosis not present

## 2014-03-18 HISTORY — PX: LAPAROSCOPIC NISSEN FUNDOPLICATION: SHX1932

## 2014-03-18 SURGERY — FUNDOPLICATION, NISSEN, LAPAROSCOPIC
Anesthesia: General

## 2014-03-18 MED ORDER — METRONIDAZOLE IN NACL 5-0.79 MG/ML-% IV SOLN
500.0000 mg | INTRAVENOUS | Status: AC
  Administered 2014-03-18: 500 mg via INTRAVENOUS
  Filled 2014-03-18: qty 100

## 2014-03-18 MED ORDER — MIDAZOLAM HCL 2 MG/2ML IJ SOLN
INTRAMUSCULAR | Status: AC
  Filled 2014-03-18: qty 2

## 2014-03-18 MED ORDER — AMITRIPTYLINE HCL 25 MG PO TABS
25.0000 mg | ORAL_TABLET | Freq: Every evening | ORAL | Status: DC | PRN
  Filled 2014-03-18: qty 1

## 2014-03-18 MED ORDER — METOCLOPRAMIDE HCL 5 MG/ML IJ SOLN
INTRAMUSCULAR | Status: AC
  Filled 2014-03-18: qty 2

## 2014-03-18 MED ORDER — HYDROMORPHONE HCL PF 1 MG/ML IJ SOLN
0.2500 mg | INTRAMUSCULAR | Status: DC | PRN
  Administered 2014-03-18: 0.5 mg via INTRAVENOUS

## 2014-03-18 MED ORDER — METOPROLOL TARTRATE 1 MG/ML IV SOLN
INTRAVENOUS | Status: AC
  Filled 2014-03-18: qty 5

## 2014-03-18 MED ORDER — PANTOPRAZOLE SODIUM 40 MG PO TBEC
80.0000 mg | DELAYED_RELEASE_TABLET | Freq: Every day | ORAL | Status: DC
  Filled 2014-03-18: qty 2

## 2014-03-18 MED ORDER — LACTATED RINGERS IR SOLN
Status: DC | PRN
  Administered 2014-03-18: 1000 mL

## 2014-03-18 MED ORDER — DEXMEDETOMIDINE HCL IN NACL 200 MCG/50ML IV SOLN
0.2000 ug/kg/h | INTRAVENOUS | Status: DC
  Administered 2014-03-18: .25 ug/kg/h via INTRAVENOUS
  Filled 2014-03-18: qty 50

## 2014-03-18 MED ORDER — DEXAMETHASONE SODIUM PHOSPHATE 10 MG/ML IJ SOLN
INTRAMUSCULAR | Status: DC | PRN
  Administered 2014-03-18: 10 mg via INTRAVENOUS

## 2014-03-18 MED ORDER — PROMETHAZINE HCL 25 MG/ML IJ SOLN
6.2500 mg | INTRAMUSCULAR | Status: DC | PRN
  Administered 2014-03-18: 12.5 mg via INTRAVENOUS

## 2014-03-18 MED ORDER — OXYCODONE HCL 5 MG PO TABS: 5.0000 mg | ORAL_TABLET | ORAL | Status: DC | PRN

## 2014-03-18 MED ORDER — LORATADINE 10 MG PO TABS
10.0000 mg | ORAL_TABLET | Freq: Every day | ORAL | Status: DC
  Administered 2014-03-18 – 2014-03-20 (×3): 10 mg via ORAL
  Filled 2014-03-18 (×4): qty 1

## 2014-03-18 MED ORDER — METOCLOPRAMIDE HCL 5 MG/ML IJ SOLN: 5.0000 mg | Freq: Four times a day (QID) | INTRAMUSCULAR | Status: DC | PRN

## 2014-03-18 MED ORDER — METRONIDAZOLE IN NACL 5-0.79 MG/ML-% IV SOLN
INTRAVENOUS | Status: AC
  Filled 2014-03-18: qty 100

## 2014-03-18 MED ORDER — KETOROLAC TROMETHAMINE 30 MG/ML IJ SOLN
15.0000 mg | Freq: Once | INTRAMUSCULAR | Status: AC | PRN
  Administered 2014-03-18: 30 mg via INTRAVENOUS

## 2014-03-18 MED ORDER — BUPIVACAINE-EPINEPHRINE 0.25% -1:200000 IJ SOLN
INTRAMUSCULAR | Status: DC | PRN
  Administered 2014-03-18: 50 mL

## 2014-03-18 MED ORDER — PROMETHAZINE HCL 25 MG/ML IJ SOLN
INTRAMUSCULAR | Status: AC
  Filled 2014-03-18: qty 1

## 2014-03-18 MED ORDER — HYDROMORPHONE HCL PF 1 MG/ML IJ SOLN
INTRAMUSCULAR | Status: AC
  Filled 2014-03-18: qty 1

## 2014-03-18 MED ORDER — MENTHOL 3 MG MT LOZG: 1.0000 | LOZENGE | OROMUCOSAL | Status: DC | PRN

## 2014-03-18 MED ORDER — EPINEPHRINE 0.3 MG/0.3ML IJ SOAJ: 0.3000 mg | INTRAMUSCULAR | Status: DC | PRN

## 2014-03-18 MED ORDER — PROMETHAZINE HCL 25 MG/ML IJ SOLN
6.2500 mg | INTRAMUSCULAR | Status: DC | PRN
  Administered 2014-03-19: 6.25 mg via INTRAVENOUS
  Administered 2014-03-20: 12.5 mg via INTRAVENOUS
  Filled 2014-03-18 (×2): qty 1

## 2014-03-18 MED ORDER — LIP MEDEX EX OINT
1.0000 | TOPICAL_OINTMENT | Freq: Two times a day (BID) | CUTANEOUS | Status: DC
Start: 2014-03-18 — End: 2014-03-21
  Administered 2014-03-19 – 2014-03-20 (×3): 1 via TOPICAL
  Filled 2014-03-18 (×4): qty 7

## 2014-03-18 MED ORDER — PHENYLEPHRINE 40 MCG/ML (10ML) SYRINGE FOR IV PUSH (FOR BLOOD PRESSURE SUPPORT)
PREFILLED_SYRINGE | INTRAVENOUS | Status: AC
  Filled 2014-03-18: qty 10

## 2014-03-18 MED ORDER — NEOSTIGMINE METHYLSULFATE 10 MG/10ML IV SOLN
INTRAVENOUS | Status: DC | PRN
  Administered 2014-03-18: 4 mg via INTRAVENOUS

## 2014-03-18 MED ORDER — ROCURONIUM BROMIDE 100 MG/10ML IV SOLN
INTRAVENOUS | Status: DC | PRN
  Administered 2014-03-18: 20 mg via INTRAVENOUS
  Administered 2014-03-18: 50 mg via INTRAVENOUS

## 2014-03-18 MED ORDER — BISACODYL 10 MG RE SUPP
10.0000 mg | Freq: Two times a day (BID) | RECTAL | Status: DC | PRN
Start: 2014-03-18 — End: 2014-03-21

## 2014-03-18 MED ORDER — STERILE WATER FOR IRRIGATION IR SOLN
Status: DC | PRN
  Administered 2014-03-18: 1500 mL

## 2014-03-18 MED ORDER — GLYCOPYRROLATE 0.2 MG/ML IJ SOLN
INTRAMUSCULAR | Status: DC | PRN
  Administered 2014-03-18: .6 mg via INTRAVENOUS

## 2014-03-18 MED ORDER — METOCLOPRAMIDE HCL 5 MG/ML IJ SOLN
INTRAMUSCULAR | Status: DC | PRN
  Administered 2014-03-18: 10 mg via INTRAVENOUS

## 2014-03-18 MED ORDER — FENTANYL CITRATE 0.05 MG/ML IJ SOLN
INTRAMUSCULAR | Status: DC | PRN
  Administered 2014-03-18 (×2): 50 ug via INTRAVENOUS
  Administered 2014-03-18: 100 ug via INTRAVENOUS
  Administered 2014-03-18: 50 ug via INTRAVENOUS

## 2014-03-18 MED ORDER — DICLOFENAC EPOLAMINE 1.3 % TD PTCH
1.0000 | MEDICATED_PATCH | Freq: Two times a day (BID) | TRANSDERMAL | Status: DC
  Administered 2014-03-18 – 2014-03-21 (×6): 1 via TRANSDERMAL
  Filled 2014-03-18 (×10): qty 1

## 2014-03-18 MED ORDER — HYDROMORPHONE HCL PF 1 MG/ML IJ SOLN
0.5000 mg | INTRAMUSCULAR | Status: DC | PRN
  Administered 2014-03-18 (×3): 1 mg via INTRAVENOUS
  Administered 2014-03-18 – 2014-03-19 (×2): 2 mg via INTRAVENOUS
  Administered 2014-03-19: 1 mg via INTRAVENOUS
  Administered 2014-03-19: 2 mg via INTRAVENOUS
  Administered 2014-03-19 – 2014-03-20 (×4): 1 mg via INTRAVENOUS
  Filled 2014-03-18: qty 2
  Filled 2014-03-18 (×3): qty 1
  Filled 2014-03-18: qty 2
  Filled 2014-03-18: qty 1
  Filled 2014-03-18: qty 2
  Filled 2014-03-18: qty 1
  Filled 2014-03-18: qty 2
  Filled 2014-03-18 (×3): qty 1

## 2014-03-18 MED ORDER — MIDAZOLAM HCL 5 MG/5ML IJ SOLN
INTRAMUSCULAR | Status: DC | PRN
  Administered 2014-03-18: 2 mg via INTRAVENOUS

## 2014-03-18 MED ORDER — LIDOCAINE HCL (CARDIAC) 20 MG/ML IV SOLN
INTRAVENOUS | Status: AC
  Filled 2014-03-18: qty 5

## 2014-03-18 MED ORDER — BUPIVACAINE-EPINEPHRINE 0.25% -1:200000 IJ SOLN
INTRAMUSCULAR | Status: AC
  Filled 2014-03-18: qty 1

## 2014-03-18 MED ORDER — LACTATED RINGERS IV BOLUS (SEPSIS): 1000.0000 mL | Freq: Three times a day (TID) | INTRAVENOUS | Status: AC | PRN

## 2014-03-18 MED ORDER — PAROXETINE HCL 10 MG PO TABS
5.0000 mg | ORAL_TABLET | Freq: Every day | ORAL | Status: DC
Start: 2014-03-18 — End: 2014-03-21
  Administered 2014-03-18: 22:00:00 via ORAL
  Administered 2014-03-19 – 2014-03-20 (×2): 5 mg via ORAL
  Filled 2014-03-18 (×4): qty 0.5

## 2014-03-18 MED ORDER — METOCLOPRAMIDE HCL 10 MG PO TABS: 10.0000 mg | ORAL_TABLET | Freq: Four times a day (QID) | ORAL | Status: AC | PRN

## 2014-03-18 MED ORDER — ONDANSETRON HCL 4 MG/2ML IJ SOLN
INTRAMUSCULAR | Status: AC
  Filled 2014-03-18: qty 2

## 2014-03-18 MED ORDER — PROPOFOL 10 MG/ML IV BOLUS
INTRAVENOUS | Status: AC
  Filled 2014-03-18: qty 20

## 2014-03-18 MED ORDER — ALUM & MAG HYDROXIDE-SIMETH 200-200-20 MG/5ML PO SUSP
30.0000 mL | Freq: Four times a day (QID) | ORAL | Status: DC | PRN
Start: 2014-03-18 — End: 2014-03-21

## 2014-03-18 MED ORDER — LIDOCAINE HCL (CARDIAC) 20 MG/ML IV SOLN
INTRAVENOUS | Status: DC | PRN
  Administered 2014-03-18: 50 mg via INTRAVENOUS

## 2014-03-18 MED ORDER — MAGIC MOUTHWASH
15.0000 mL | Freq: Four times a day (QID) | ORAL | Status: DC | PRN
  Administered 2014-03-19: 15 mL via ORAL
  Filled 2014-03-18: qty 15

## 2014-03-18 MED ORDER — CETYLPYRIDINIUM CHLORIDE 0.05 % MT LIQD: 7.0000 mL | Freq: Two times a day (BID) | OROMUCOSAL | Status: DC

## 2014-03-18 MED ORDER — TAPENTADOL HCL 50 MG PO TABS
50.0000 mg | ORAL_TABLET | Freq: Four times a day (QID) | ORAL | Status: DC | PRN
  Administered 2014-03-19: 50 mg via ORAL
  Filled 2014-03-18: qty 1

## 2014-03-18 MED ORDER — DEXAMETHASONE SODIUM PHOSPHATE 10 MG/ML IJ SOLN
INTRAMUSCULAR | Status: AC
  Filled 2014-03-18: qty 1

## 2014-03-18 MED ORDER — FENTANYL CITRATE 0.05 MG/ML IJ SOLN
INTRAMUSCULAR | Status: AC
  Filled 2014-03-18: qty 5

## 2014-03-18 MED ORDER — HEPARIN SODIUM (PORCINE) 5000 UNIT/ML IJ SOLN
5000.0000 [IU] | Freq: Three times a day (TID) | INTRAMUSCULAR | Status: DC
  Administered 2014-03-18 – 2014-03-21 (×9): 5000 [IU] via SUBCUTANEOUS
  Filled 2014-03-18 (×12): qty 1

## 2014-03-18 MED ORDER — MAGNESIUM HYDROXIDE 400 MG/5ML PO SUSP: 30.0000 mL | Freq: Two times a day (BID) | ORAL | Status: DC | PRN

## 2014-03-18 MED ORDER — PHENYLEPHRINE HCL 10 MG/ML IJ SOLN
INTRAMUSCULAR | Status: DC | PRN
  Administered 2014-03-18: 80 ug via INTRAVENOUS
  Administered 2014-03-18: 40 ug via INTRAVENOUS

## 2014-03-18 MED ORDER — VANCOMYCIN HCL IN DEXTROSE 1-5 GM/200ML-% IV SOLN
INTRAVENOUS | Status: AC
  Filled 2014-03-18: qty 200

## 2014-03-18 MED ORDER — LACTATED RINGERS IV SOLN
INTRAVENOUS | Status: DC
  Administered 2014-03-18 (×2): via INTRAVENOUS

## 2014-03-18 MED ORDER — ONDANSETRON HCL 4 MG/2ML IJ SOLN
INTRAMUSCULAR | Status: DC | PRN
  Administered 2014-03-18: 4 mg via INTRAVENOUS

## 2014-03-18 MED ORDER — NEOSTIGMINE METHYLSULFATE 10 MG/10ML IV SOLN
INTRAVENOUS | Status: AC
  Filled 2014-03-18: qty 1

## 2014-03-18 MED ORDER — HYDROMORPHONE HCL PF 2 MG/ML IJ SOLN
INTRAMUSCULAR | Status: AC
  Filled 2014-03-18: qty 1

## 2014-03-18 MED ORDER — ROCURONIUM BROMIDE 100 MG/10ML IV SOLN
INTRAVENOUS | Status: AC
  Filled 2014-03-18: qty 1

## 2014-03-18 MED ORDER — 0.9 % SODIUM CHLORIDE (POUR BTL) OPTIME
TOPICAL | Status: DC | PRN
  Administered 2014-03-18: 1000 mL

## 2014-03-18 MED ORDER — LORAZEPAM 2 MG/ML IJ SOLN: 0.5000 mg | Freq: Three times a day (TID) | INTRAMUSCULAR | Status: DC | PRN

## 2014-03-18 MED ORDER — ACETAMINOPHEN 10 MG/ML IV SOLN
1000.0000 mg | Freq: Once | INTRAVENOUS | Status: AC
  Administered 2014-03-18: 1000 mg via INTRAVENOUS
  Filled 2014-03-18: qty 100

## 2014-03-18 MED ORDER — LACTATED RINGERS IV SOLN: INTRAVENOUS | Status: DC

## 2014-03-18 MED ORDER — LACTATED RINGERS IV SOLN
INTRAVENOUS | Status: DC | PRN
  Administered 2014-03-18 (×3): via INTRAVENOUS

## 2014-03-18 MED ORDER — HYDROMORPHONE HCL PF 1 MG/ML IJ SOLN
INTRAMUSCULAR | Status: DC | PRN
  Administered 2014-03-18: 0.5 mg via INTRAVENOUS
  Administered 2014-03-18: 1 mg via INTRAVENOUS
  Administered 2014-03-18: 0.5 mg via INTRAVENOUS

## 2014-03-18 MED ORDER — PROMETHAZINE HCL 25 MG RE SUPP: 25.0000 mg | Freq: Four times a day (QID) | RECTAL | Status: AC | PRN

## 2014-03-18 MED ORDER — PHENOL 1.4 % MT LIQD: 2.0000 | OROMUCOSAL | Status: DC | PRN

## 2014-03-18 MED ORDER — SACCHAROMYCES BOULARDII 250 MG PO CAPS
250.0000 mg | ORAL_CAPSULE | Freq: Two times a day (BID) | ORAL | Status: DC
  Administered 2014-03-19 – 2014-03-21 (×6): 250 mg via ORAL
  Filled 2014-03-18 (×9): qty 1

## 2014-03-18 MED ORDER — KETOROLAC TROMETHAMINE 30 MG/ML IJ SOLN
INTRAMUSCULAR | Status: AC
  Filled 2014-03-18: qty 1

## 2014-03-18 SURGICAL SUPPLY — 59 items
APPLIER CLIP 5 13 M/L LIGAMAX5 (MISCELLANEOUS)
APPLIER CLIP ROT 10 11.4 M/L (STAPLE)
CABLE HIGH FREQUENCY MONO STRZ (ELECTRODE) ×3 IMPLANT
CANISTER SUCTION 2500CC (MISCELLANEOUS) IMPLANT
CHLORAPREP W/TINT 26ML (MISCELLANEOUS) ×3 IMPLANT
CLIP APPLIE 5 13 M/L LIGAMAX5 (MISCELLANEOUS) IMPLANT
CLIP APPLIE ROT 10 11.4 M/L (STAPLE) IMPLANT
COVER MAYO STAND STRL (DRAPES) IMPLANT
DECANTER SPIKE VIAL GLASS SM (MISCELLANEOUS) ×3 IMPLANT
DRAIN CHANNEL 19F RND (DRAIN) IMPLANT
DRAIN PENROSE 18X1/2 LTX STRL (DRAIN) IMPLANT
DRAPE LAPAROSCOPIC ABDOMINAL (DRAPES) ×3 IMPLANT
DRAPE UTILITY XL STRL (DRAPES) IMPLANT
DRAPE WARM FLUID 44X44 (DRAPE) ×3 IMPLANT
DRSG TEGADERM 2-3/8X2-3/4 SM (GAUZE/BANDAGES/DRESSINGS) ×15 IMPLANT
ELECT REM PT RETURN 9FT ADLT (ELECTROSURGICAL) ×3
ELECTRODE REM PT RTRN 9FT ADLT (ELECTROSURGICAL) ×1 IMPLANT
EVACUATOR SILICONE 100CC (DRAIN) IMPLANT
FELT TEFLON 4 X1 (Mesh General) ×3 IMPLANT
GLOVE BIO SURGEON STRL SZ8 (GLOVE) ×3 IMPLANT
GLOVE BIOGEL PI IND STRL 6.5 (GLOVE) ×2 IMPLANT
GLOVE BIOGEL PI IND STRL 8.5 (GLOVE) IMPLANT
GLOVE BIOGEL PI INDICATOR 6.5 (GLOVE) ×4
GLOVE BIOGEL PI INDICATOR 8.5 (GLOVE)
GLOVE ECLIPSE 8.0 STRL XLNG CF (GLOVE) ×3 IMPLANT
GLOVE EUDERMIC 7 POWDERFREE (GLOVE) ×3 IMPLANT
GLOVE INDICATOR 8.0 STRL GRN (GLOVE) ×3 IMPLANT
GLOVE SURG SS PI 6.5 STRL IVOR (GLOVE) ×6 IMPLANT
GLOVE SURG SS PI 7.0 STRL IVOR (GLOVE) ×6 IMPLANT
GOWN L4 LG 24 PK N/S (GOWN DISPOSABLE) IMPLANT
GOWN STRL REUS W/TWL XL LVL3 (GOWN DISPOSABLE) ×18 IMPLANT
KIT BASIN OR (CUSTOM PROCEDURE TRAY) ×3 IMPLANT
LEGGING LITHOTOMY PAIR STRL (DRAPES) ×6 IMPLANT
MANIFOLD NEPTUNE II (INSTRUMENTS) ×3 IMPLANT
MARKER SKIN DUAL TIP RULER LAB (MISCELLANEOUS) ×3 IMPLANT
SCISSORS LAP 5X35 DISP (ENDOMECHANICALS) ×3 IMPLANT
SET IRRIG TUBING LAPAROSCOPIC (IRRIGATION / IRRIGATOR) ×3 IMPLANT
SHEARS HARMONIC ACE PLUS 36CM (ENDOMECHANICALS) ×3 IMPLANT
SLEEVE XCEL OPT CAN 5 100 (ENDOMECHANICALS) ×9 IMPLANT
SPONGE DRAIN TRACH 4X4 STRL 2S (GAUZE/BANDAGES/DRESSINGS) IMPLANT
STAPLER VISISTAT 35W (STAPLE) IMPLANT
SUT ETHIBOND 0 (SUTURE) ×3 IMPLANT
SUT ETHIBOND 0 36 GRN (SUTURE) ×6 IMPLANT
SUT ETHIBOND NAB CT1 #1 30IN (SUTURE) ×3 IMPLANT
SUT ETHILON 2 0 PS N (SUTURE) IMPLANT
SUT MNCRL AB 4-0 PS2 18 (SUTURE) ×3 IMPLANT
SUT V-LOC BARB 180 2/0GR6 GS22 (SUTURE) ×9
SUTURE V-LC BRB 180 2/0GR6GS22 (SUTURE) ×3 IMPLANT
TIP INNERVISION DETACH 40FR (MISCELLANEOUS) IMPLANT
TIP INNERVISION DETACH 50FR (MISCELLANEOUS) IMPLANT
TIP INNERVISION DETACH 56FR (MISCELLANEOUS) ×3 IMPLANT
TIPS INNERVISION DETACH 40FR (MISCELLANEOUS)
TOWEL OR 17X26 10 PK STRL BLUE (TOWEL DISPOSABLE) ×3 IMPLANT
TOWEL OR NON WOVEN STRL DISP B (DISPOSABLE) ×3 IMPLANT
TRAY FOLEY CATH 14FRSI W/METER (CATHETERS) ×3 IMPLANT
TRAY LAP CHOLE (CUSTOM PROCEDURE TRAY) ×3 IMPLANT
TROCAR BLADELESS OPT 5 100 (ENDOMECHANICALS) ×3 IMPLANT
TROCAR XCEL NON-BLD 11X100MML (ENDOMECHANICALS) ×3 IMPLANT
TUBING FILTER THERMOFLATOR (ELECTROSURGICAL) ×3 IMPLANT

## 2014-03-18 NOTE — Transfer of Care (Signed)
Immediate Anesthesia Transfer of Care Note  Patient: Theresa Shelton  Procedure(s) Performed: Procedure(s) (LRB): LAPAROSCOPIC NISSEN FUNDOPLICATION & pylorplasty,omentapexy ([name])  Patient Location: PACU  Anesthesia Type: General  Level of Consciousness: sedated, patient cooperative and responds to stimulation  Airway & Oxygen Therapy: Patient Spontanous Breathing and Patient connected to face mask oxgen  Post-op Assessment: Report given to PACU RN and Post -op Vital signs reviewed and stable  Post vital signs: Reviewed and stable  Complications: No apparent anesthesia complications

## 2014-03-18 NOTE — Progress Notes (Signed)
PACU note----spoke to Dr. Michaell Cowing, do not need to do CXR as long as NG tube is draining; NG irrigates well with return of irrigant and small amount bloody drainage; pt given toradol IV to help with pain control and phenergan IV to help with post op nausea; pt dozing at long interval at present

## 2014-03-18 NOTE — Anesthesia Postprocedure Evaluation (Signed)
  Anesthesia Post-op Note  Patient: Theresa Shelton  Procedure(s) Performed: Procedure(s) (LRB): LAPAROSCOPIC NISSEN FUNDOPLICATION & pylorplasty,omentapexy ([name])  Patient Location: PACU  Anesthesia Type: General  Level of Consciousness: awake and alert   Airway and Oxygen Therapy: Patient Spontanous Breathing  Post-op Pain: mild  Post-op Assessment: Post-op Vital signs reviewed, Patient's Cardiovascular Status Stable, Respiratory Function Stable, Patent Airway and No signs of Nausea or vomiting  Last Vitals:  Filed Vitals:   03/18/14 1100  BP: 124/76  Pulse: 98  Temp:   Resp: 14    Post-op Vital Signs: stable   Complications: No apparent anesthesia complications

## 2014-03-18 NOTE — Discharge Instructions (Signed)
EATING AFTER YOUR ESOPHAGEAL SURGERY (Stomach Fundoplication, Hiatal Hernia repair, Achalasia surgery, etc)  After your esophageal surgery, expect some sticking with swallowing over the next 1-2 months.    If food sticks when you eat, it is called "dysphagia".  This is due to swelling around your esophagus at the wrap & hiatal diaphragm repair.  It will gradually ease off over the next few months.  To help you through this temporary phase, we start you out on a pureed (blenderized) diet.  Your first meal in the hospital was thin liquids.  You should have been given a pureed diet by the time you left the hospital.  We ask patients to stay on a pureed diet for the first 2-3 weeks to avoid anything getting "stuck" near your recent surgery.  Don't be alarmed if your ability to swallow doesn't progress according to this plan.  Everyone is different and some diets can advance more or less quickly.     Some BASIC RULES to follow are:  Maintain an upright position whenever eating or drinking.  Take small bites - just a teaspoon size bite at a time.  Eat slowly.  It may also help to eat only one food at a time.  Consider nibbling through smaller, more frequent meals & avoid the urge to eat BIG meals  Do not push through feelings of fullness, nausea, or bloatedness  Do not mix solid foods and liquids in the same mouthful  Try not to "wash foods down" with large gulps of liquids. Avoid carbonated (bubbly/fizzy) drinks.  Understand that it will be hard to burp and belch at first.  This gradually improves with time.  Expect to be more gassy/flatulent/bloated initially.  Walking will help you work through that.  Maalox/Gas-X can help as well.  Eat in a relaxed atmosphere & minimize distractions.  Avoid talking while eating.    Do not use straws.  Following each meal, sit in an upright position (90 degree angle) for 60 to 90 minutes.  Going for a short walk can help as well  If food does stick,  don't panic.  Try to relax and let the food pass on its own.  Sipping WARM LIQUID such as strong hot black tea can also help slide it down.   Be gradual in changes & use common sense:  -If you easily tolerating a certain "level" of foods, advance to the next level gradually -If you are having trouble swallowing a particular food, then avoid it.   -If food is sticking when you advance your diet, go back to thinner previous diet (the lower LEVEL) for 1-2 days.  LEVEL 1 = PUREED DIET  Do for the first 2 WEEKS AFTER SURGERY  -Foods in this group are pureed or blenderized to a smooth, mashed potato-like consistency.  -If necessary, the pureed foods can keep their shape with the addition of a thickening agent.   -Meat should be pureed to a smooth, pasty consistency.  Hot broth or gravy may be added to the pureed meat, approximately 1 oz. of liquid per 3 oz. serving of meat. -CAUTION:  If any foods do not puree into a smooth consistency, swallowing will be more difficult.  (For example, nuts or seeds sometimes do not blend well.)  Hot Foods Cold Foods  Pureed scrambled eggs and cheese Pureed cottage cheese  Baby cereals Thickened juices and nectars  Thinned cooked cereals (no lumps) Thickened milk or eggnog  Pureed [place] toast or pancakes Ensure  Mashed  potatoes Ice cream  °Pureed parsley, au gratin, scalloped potatoes, candied sweet potatoes Fruit or Italian ice, sherbet  °Pureed buttered or alfredo noodles Plain yogurt  °Pureed vegetables (no corn or peas) Instant breakfast  °Pureed soups and creamed soups Smooth pudding, mousse, custard  °Pureed scalloped apples Whipped gelatin  °Gravies Sugar, syrup, honey, jelly  °Sauces, cheese, tomato, barbecue, white, creamed Cream  °Any baby food Creamer  °Alcohol in moderation (not beer or champagne) Margarine  °Coffee or tea Mayonnaise  ° Ketchup, mustard  ° Apple sauce  ° °SAMPLE MENU:  PUREED DIET °Breakfast Lunch Dinner  °· Orange juice, 1/2  cup °· Cream of wheat, 1/2 cup · Pineapple juice, 1/2 cup · Pureed turkey, barley soup, 3/4 cup °· Pureed Hawaiian chicken, 3 oz  °· Scrambled eggs, mashed or blended with cheese, 1/2 cup °· Tea or coffee, 1 cup  °· Whole milk, 1 cup  °· Non-dairy creamer, 2 Tbsp. · Mashed potatoes, 1/2 cup °· Pureed cooled broccoli, 1/2 cup °· Apple sauce, 1/2 cup °· Coffee or tea · Mashed potatoes, 1/2 cup °· Pureed spinach, 1/2 cup °· Frozen yogurt, 1/2 cup °· Tea or coffee  ° ° ° ° °LEVEL 2 = SOFT DIET ° °After your first 2 weeks, you can advance to a soft diet.   °Keep on this diet until everything goes down easily. ° °Hot Foods Cold Foods  °White fish Cottage cheese  °Stuffed fish Junior baby fruit  °Baby food meals Semi thickened juices  °Minced soft cooked, scrambled, poached eggs nectars  °Souffle & omelets Ripe mashed bananas  °Cooked cereals Canned fruit, pineapple sauce, milk  °potatoes Milkshake  °Buttered or Alfredo noodles Custard  °Cooked cooled vegetable Puddings, including tapioca  °Sherbet Yogurt  °Vegetable soup or alphabet soup Fruit ice, Italian ice  °Gravies Whipped gelatin  °Sugar, syrup, honey, jelly Junior baby desserts  °Sauces:  Cheese, creamed, barbecue, tomato, white Cream  °Coffee or tea Margarine  ° °SAMPLE MENU:  LEVEL 2 °Breakfast Lunch Dinner  °· Orange juice, 1/2 cup °· Oatmeal, 1/2 cup °· Scrambled eggs with cheese, 1/2 cup °· Decaffeinated tea, 1 cup °· Whole milk, 1 cup °· Non-dairy creamer, 2 Tbsp · Pineapple juice, 1/2 cup °· Minced beef, 3 oz °· Gravy, 2 Tbsp °· Mashed potatoes, 1/2 cup °· Minced fresh broccoli, 1/2 cup °· Applesauce, 1/2 cup °· Coffee, 1 cup · Turkey, barley soup, 3/4 cup °· Minced Hawaiian chicken, 3 oz °· Mashed potatoes, 1/2 cup °· Cooked spinach, 1/2 cup °· Frozen yogurt, 1/2 cup °· Non-dairy creamer, 2 Tbsp  ° ° ° ° °LEVEL 3 = CHOPPED DIET ° °-After all the foods in level 2 (soft diet) are passing through well you should advance up to more chopped foods.  °-It is still  important to cut these foods into small pieces and eat slowly. ° °Hot Foods Cold Foods  °Poultry Cottage cheese  °Chopped Swedish meatballs Yogurt  °Meat salads (ground or flaked meat) Milk  °Flaked fish (tuna) Milkshakes  °Poached or scrambled eggs Soft, cold, dry cereal  °Souffles and omelets Fruit juices or nectars  °Cooked cereals Chopped canned fruit  °Chopped French toast or pancakes Canned fruit cocktail  °Noodles or pasta (no rice) Pudding, mousse, custard  °Cooked vegetables (no frozen peas, corn, or mixed vegetables) Green salad  °Canned small sweet peas Ice cream  °Creamed soup or vegetable soup Fruit ice, Italian ice  °Pureed vegetable soup or alphabet soup Non-dairy creamer  °Ground scalloped   apples Margarine  Gravies Mayonnaise  Sauces:  Cheese, creamed, barbecue, tomato, white Ketchup  Coffee or tea Mustard   SAMPLE MENU:  LEVEL 3 Breakfast Lunch Dinner   Orange juice, 1/2 cup  Oatmeal, 1/2 cup  Scrambled eggs with cheese, 1/2 cup  Decaffeinated tea, 1 cup  Whole milk, 1 cup  Non-dairy creamer, 2 Tbsp  Ketchup, 1 Tbsp  Margarine, 1 tsp  Salt, 1/4 tsp  Sugar, 2 tsp  Pineapple juice, 1/2 cup  Ground beef, 3 oz  Gravy, 2 Tbsp  Mashed potatoes, 1/2 cup  Cooked spinach, 1/2 cup  Applesauce, 1/2 cup  Decaffeinated coffee  Whole milk  Non-dairy creamer, 2 Tbsp  Margarine, 1 tsp  Salt, 1/4 tsp  Pureed [place], barley soup, 3/4 cup  Barbecue chicken, 3 oz  Mashed potatoes, 1/2 cup  Ground fresh broccoli, 1/2 cup  Frozen yogurt, 1/2 cup  Decaffeinated tea, 1 cup  Non-dairy creamer, 2 Tbsp  Margarine, 1 tsp  Salt, 1/4 tsp  Sugar, 1 tsp    LEVEL 4:  REGULAR FOODS  -Foods in this group are soft, moist, regularly textured foods.   -This level includes meat and breads, which tend to be the hardest things to swallow.   -Eat very slowly, chew well and continue to avoid carbonated drinks. -most people are at this level in 4-6 weeks  Hot Foods  Cold Foods  Baked fish or skinned Soft cheeses - cottage cheese  Souffles and omelets Cream cheese  Eggs Yogurt  Stuffed shells Milk  Spaghetti with meat sauce Milkshakes  Cooked cereal Cold dry cereals (no nuts, dried fruit, coconut)  [place] toast or pancakes Crackers  Buttered toast Fruit juices or nectars  Noodles or pasta (no rice) Canned fruit  Potatoes (all types) Ripe bananas  Soft, cooked vegetables (no corn, lima, or baked beans) Peeled, ripe, fresh fruit  Creamed soups or vegetable soup Cakes (no nuts, dried fruit, coconut)  Canned chicken noodle soup Plain doughnuts  Gravies Ice cream  Bacon dressing Pudding, mousse, custard  Sauces:  Cheese, creamed, barbecue, tomato, white Fruit ice, [place] ice, sherbet  Decaffeinated tea or coffee Whipped gelatin  Pork chops Regular gelatin   Canned fruited gelatin molds   Sugar, syrup, honey, jam, jelly   Cream   Non-dairy   Margarine   Oil   Mayonnaise   Ketchup   Mustard    If you have any questions please call our office at [place]: [phone].  LAPAROSCOPIC SURGERY: POST OP INSTRUCTIONS  1. DIET: Follow a light bland diet the first 24 hours after arrival home, such as soup, liquids, crackers, etc.  Be sure to include lots of fluids daily.  Avoid fast food or heavy meals as your are more likely to get nauseated.  Eat a low fat the next few days after surgery.   2. Take your usually prescribed home medications unless otherwise directed. 3. PAIN CONTROL: a. Pain is best controlled by a usual combination of three different methods TOGETHER: i. Ice/Heat ii. Over the counter pain medication iii. Prescription pain medication b. Most patients will experience some swelling and bruising around the incisions.  Ice packs or heating pads (30-60 minutes up to 6 times a day) will help. Use ice for the first few days to help decrease swelling and bruising, then switch to heat to help relax tight/sore spots and speed  recovery.  Some people prefer to use ice alone, heat alone, alternating between ice & heat.  Experiment to what  works for you.  Swelling and bruising can take several weeks to resolve.   c. It is helpful to take an over-the-counter pain medication regularly for the first few weeks.  Choose one of the following that works best for you: i. Naproxen (Aleve, etc)  Two '220mg'$  tabs twice a day ii. Ibuprofen (Advil, etc) Three '200mg'$  tabs four times a day (every meal & bedtime) iii. Acetaminophen (Tylenol, etc) 500-'650mg'$  four times a day (every meal & bedtime) d. A  prescription for pain medication (such as oxycodone, hydrocodone, etc) should be given to you upon discharge.  Take your pain medication as prescribed.  i. If you are having problems/concerns with the prescription medicine (does not control pain, nausea, vomiting, rash, itching, etc), please call us [phone] to see if we need to switch you to a different pain medicine that will work better for you and/or control your side effect better. ii. If you need a refill on your pain medication, please contact your pharmacy.  They will contact our office to request authorization. Prescriptions will not be filled after 5 pm or on week-ends. 4. Avoid getting constipated.  Between the surgery and the pain medications, it is common to experience some constipation.  Increasing fluid intake and taking a fiber supplement (such as Metamucil, Citrucel, FiberCon, MiraLax, etc) 1-2 times a day regularly will usually help prevent this problem from occurring.  A mild laxative (prune juice, Milk of Magnesia, MiraLax, etc) should be taken according to package directions if there are no bowel movements after 48 hours.   5. Watch out for diarrhea.  If you have many loose bowel movements, simplify your diet to bland foods & liquids for a few days.  Stop any stool softeners and decrease your fiber supplement.  Switching to mild anti-diarrheal medications (Kayopectate, Pepto  Bismol) can help.  If this worsens or does not improve, please call us. 6. Wash / shower every day.  You may shower over the dressings as they are waterproof.  Continue to shower over incision(s) after the dressing is off. 7. Remove your waterproof bandages 5 days after surgery.  You may leave the incision open to air.  You may replace a dressing/Band-Aid to cover the incision for comfort if you wish.  8. ACTIVITIES as tolerated:   a. You may resume regular (light) daily activities beginning the next day--such as daily self-care, walking, climbing stairs--gradually increasing activities as tolerated.  If you can walk 30 minutes without difficulty, it is safe to try more intense activity such as jogging, treadmill, bicycling, low-impact aerobics, swimming, etc. b. Save the most intensive and strenuous activity for last such as sit-ups, heavy lifting, contact sports, etc  Refrain from any heavy lifting or straining until you are off narcotics for pain control.   c. DO NOT PUSH THROUGH PAIN.  Let pain be your guide: If it hurts to do something, don't do it.  Pain is your body warning you to avoid that activity for another week until the pain goes down. d. You may drive when you are no longer taking prescription pain medication, you can comfortably wear a seatbelt, and you can safely maneuver your car and apply brakes. e. Dennis Bast may have sexual intercourse when it is comfortable.  9. FOLLOW UP in our office a. Please call [facility] at [phone] to set up an appointment to see your surgeon in the office for a follow-up appointment approximately 2-3 weeks after your surgery. b. Make sure that you call for  this appointment the day you arrive home to insure a convenient appointment time. 10. IF YOU HAVE DISABILITY OR FAMILY LEAVE FORMS, BRING THEM TO THE OFFICE FOR PROCESSING.  DO NOT GIVE THEM TO YOUR DOCTOR.   WHEN TO CALL us [phone]: 1. Poor pain control 2. Reactions / problems with new medications  (rash/itching, nausea, etc)  3. Fever over 101.5 F (38.5 C) 4. Inability to urinate 5. Nausea and/or vomiting 6. Worsening swelling or bruising 7. Continued bleeding from incision. 8. Increased pain, redness, or drainage from the incision   The clinic staff is available to answer your questions during regular business hours (8:30am-5pm).  Please dont hesitate to call and ask to speak to one of our nurses for clinical concerns.   If you have a medical emergency, go to the nearest emergency room or call 911.  A surgeon from [facility] is always on call at the [facility], [place], [place], [place], [place]  [id number] ? MAIN: [phone] ? TOLL FREE: [phone] ?  FAX [phone] [email or web address]  GETTING TO GOOD BOWEL HEALTH. Irregular bowel habits such as constipation and diarrhea can lead to many problems over time.  Having one soft bowel movement a day is the most important way to prevent further problems.  The anorectal canal is designed to handle stretching and feces to safely manage our ability to get rid of solid waste (feces, poop, stool) out of our body.  BUT, hard constipated stools can act like ripping concrete bricks and diarrhea can be a burning fire to this very sensitive area of our body, causing inflamed hemorrhoids, anal fissures, increasing risk is perirectal abscesses, abdominal pain/bloating, an making irritable bowel worse.     The goal: ONE SOFT BOWEL MOVEMENT A DAY!  To have soft, regular bowel movements:    Drink at least 8 tall glasses of water a day.     Take plenty of fiber.  Fiber is the undigested part of plant food that passes into the colon, acting s natures broom to encourage bowel motility and movement.  Fiber can absorb and hold large amounts of water. This results in a larger, bulkier stool, which is soft and easier to pass. Work gradually over several weeks up to 6 servings a day  of fiber (25g a day even more if needed) in the form of: o Vegetables -- Root (potatoes, carrots, turnips), leafy green (lettuce, salad greens, celery, spinach), or cooked high residue (cabbage, broccoli, etc) o Fruit -- Fresh (unpeeled skin & pulp), Dried (prunes, apricots, cherries, etc ),  or stewed ( applesauce)  o Whole grain breads, pasta, etc (whole wheat)  o Bran cereals    Bulking Agents -- This type of water-retaining fiber generally is easily obtained each day by one of the following:  o Psyllium bran -- The psyllium plant is remarkable because its ground seeds can retain so much water. This product is available as Metamucil, Konsyl, Effersyllium, Per Diem Fiber, or the less expensive generic preparation in drug and health food stores. Although labeled a laxative, it really is not a laxative.  o Methylcellulose -- This is another fiber derived from wood which also retains water. It is available as Citrucel. o Polyethylene Glycol - and artificial fiber commonly called Miralax or Glycolax.  It is helpful for people with gassy or bloated feelings with regular fiber o Flax Seed - a less gassy fiber than psyllium   No reading  or other relaxing activity while on the toilet. If bowel movements take longer than 5 minutes, you are too constipated   AVOID CONSTIPATION.  High fiber and water intake usually takes care of this.  Sometimes a laxative is needed to stimulate more frequent bowel movements, but    Laxatives are not a good long-term solution as it can wear the colon out. o Osmotics (Milk of Magnesia, Fleets phosphosoda, Magnesium citrate, MiraLax, GoLytely) are safer than  o Stimulants (Senokot, Castor Oil, Dulcolax, Ex Lax)    o Do not take laxatives for more than 7days in a row.    IF SEVERELY CONSTIPATED, try a Bowel Retraining Program: o Do not use laxatives.  o Eat a diet high in roughage, such as bran cereals and leafy vegetables.  o Drink six (6) ounces of prune or apricot juice  each morning.  o Eat two (2) large servings of stewed fruit each day.  o Take one (1) heaping tablespoon of a psyllium-based bulking agent twice a day. Use sugar-free sweetener when possible to avoid excessive calories.  o Eat a normal breakfast.  o Set aside 15 minutes after breakfast to sit on the toilet, but do not strain to have a bowel movement.  o If you do not have a bowel movement by the third day, use an enema and repeat the above steps.    Controlling diarrhea o Switch to liquids and simpler foods for a few days to avoid stressing your intestines further. o Avoid dairy products (especially milk & ice cream) for a short time.  The intestines often can lose the ability to digest lactose when stressed. o Avoid foods that cause gassiness or bloating.  Typical foods include beans and other legumes, cabbage, broccoli, and dairy foods.  Every person has some sensitivity to other foods, so listen to our body and avoid those foods that trigger problems for you. o Adding fiber (Citrucel, Metamucil, psyllium, Miralax) gradually can help thicken stools by absorbing excess fluid and retrain the intestines to act more normally.  Slowly increase the dose over a few weeks.  Too much fiber too soon can backfire and cause cramping & bloating. o Probiotics (such as active yogurt, Align, etc) may help repopulate the intestines and colon with normal bacteria and calm down a sensitive digestive tract.  Most studies show it to be of mild help, though, and such products can be costly. o Medicines:   Bismuth subsalicylate (ex. Kayopectate, Pepto Bismol) every 30 minutes for up to 6 doses can help control diarrhea.  Avoid if pregnant.   Loperamide (Immodium) can slow down diarrhea.  Start with two tablets (  total) first and then try one tablet every 6 hours.  Avoid if you are having fevers or severe pain.  If you are not better or start feeling worse, stop all medicines and call your doctor for advice o Call your  doctor if you are getting worse or not better.  Sometimes further testing (cultures, endoscopy, X-ray studies, bloodwork, etc) may be needed to help diagnose and treat the cause of the diarrhea. o

## 2014-03-18 NOTE — Op Note (Signed)
03/18/2014  11:03 AM  PATIENT:  Theresa Shelton  38 y.o. female  Patient Care Team: Reggie Pile. Dareen Piano, FNP as PCP - General (Nurse Practitioner) Theda Belfast, MD as Consulting Physician (Gastroenterology) Ardeth Sportsman, MD as Consulting Physician (General Surgery)  PRE-OPERATIVE DIAGNOSIS:  gastroesophageal reflux factory to medical management  POST-OPERATIVE DIAGNOSIS:    Gastroesophageal reflux factory to medical management Profound delayed emptying  PROCEDURES:    1. LaparoscopicType II mediastinal dissection. 2. Anterior & posterior gastropexy. 3. Nissen fundoplication 2 cm over a 56-French bougie 4.  Laparoscopic pyloroplasty 5.  Laparoscopic omentopexy  SURGEON:  Surgeon(s): Ardeth Sportsman, MD Ernestene Mention, MD - Asst  ANESTHESIA:   local and general  EBL:  Total I/O In: 2100 [I.V.:2100] Out: 150 [Urine:150]  Delay start of Pharmacological VTE agent (>24hrs) due to surgical blood loss or risk of bleeding:  no  ANESTHESIA: 1. General anesthesia. 2. Local anesthetic in a field block around all port sites.  SPECIMEN:  Mediastinal hernia sac (not sent).  DRAINS:  NO  COUNTS:  YES  PLAN OF CARE: Admit for overnight observation  PATIENT DISPOSITION:  PACU - hemodynamically stable.  INDICATION:   Patient with symptomatic Past esophageal reflux disease.  Positive by broad bone.  Also found to have significant delayed gastric emptying.  The patient has had extensive work-up & we feel the patient will benefit from repair:  The anatomy & physiology of the foregut and anti-reflux mechanism was discussed.  The pathophysiology of hiatal herniation and GERD was discussed.  Natural history risks without surgery was discussed.   The patient's symptoms are not adequately controlled by medicines and other non-operative treatments.  I feel the risks of no intervention will lead to serious problems that outweigh the operative risks; therefore, I recommended surgery to  reduce the hiatal hernia out of the chest and fundoplication to rebuild the anti-reflux valve and control reflux better.  Need for a thorough workup to rule out the differential diagnosis and plan treatment was explained.  I explained laparoscopic techniques with possible need for an open approach.  Also explained the need for pyloroplasty to help improve gastric emptying and avoid gastric bloat problems.    Risks such as bleeding, infection, abscess, leak, need for further treatment, heart attack, death, and other risks were discussed.   I noted a good likelihood this will help address the problem.  Goals of post-operative recovery were discussed as well.  Possibility that this will not correct all symptoms was explained.  Post-operative dysphagia, need for short-term liquid & pureed diet, inability to vomit, possibility of reherniation, possible need for medicines to help control symptoms in addition to surgery were discussed.  We will work to minimize complications.   Educational handouts further explaining the pathology, treatment options, and dysphagia diet was given as well.  Questions were answered.  The patient expresses understanding & wishes to proceed with surgery.  OR FINDINGS:   Moderate inflammation of distal esophagus and adhesions to the distal mediastinum consistent with chronic esophagitis  It is a primary repair over pledgets.   The patient has a 2 cm Nissen fundoplication that was done over 56-French bougie.  The patient has anterior and posterior gastropexies.  Is an anterior pyloroplasty closed with serrated V-lock suture.  DESCRIPTION:   Informed consent was confirmed.  The patient received IV antibiotics prior to incision.  The underwent general anesthesia without difficulty.  A Foley catheter sterilely placed.  The patient was positioned in split leg  with arms tucked. The abdomen was prepped and draped in the sterile fashion.  Surgical time-out confirmed our plan.  I placed  a 5 mm port in the left upper quadrant paramedian region using optical entry technique with the patient in steep reverse Trendelenburg and left side up.  Entry was clean.  We induced carbon dioxide insufflation.  Camera inspection revealed no injury.  Under direct visualization, I placed 5 mm port in the right mid abdomen and a 10 mm port on the anterior axillary line on the left subcostal ridge.  I also placed a 5 mm port in the left subxiphoid region under direct visualization.  I removed that and placed an Omega-shaped rigid Nathanson liver retractor to lift the left lateral sector of the liver anteriorly to expose the esophageal hiatus.  This was secured to the bed using the iron man system.  I placed another 5mm port in the subxiphoid region.  We made an effort to hide some port sites within her abdominal tattoos per her request.  We grasped the anterior epiphrenic esophagogastric fat pad.  Placed on tension.  Same to free the hepatogastric attachments off the right crus and bluntly enter into the anterior mediastinum.  I was able to free the esophagus from its attachments to the pericardium and bilateral pleura using primarily focused gentle blunt dissection as well as focused ultrasonic Harmonic dissection.  I transected phrenoesophageal attachments to the inner right crus, preserving a two centimeter cuff of mediastinal sac until I found the base of the crura.  I then came around anteriorly on the left side and freed up the phrenoesophageal attachments of the mediastinal sac on the medial part of the left crus on the superior half.  I did careful mediastinal dissection to free the mediastinal sac.  With that, we could relieve the suction cup affect of the hernia sac and help reduce the stomach back down into the abdomen, flipped back approriately.    We ligated the short gastrics along the lesser curvature of the stomach about a  third way down and then came up proximally over the fundus.  We released  the attachments of the stomach to the retroperitoneum until we were able to connect with the prior dissection on the left crus.  We completed the release of phrenoesophageal attachments to the medial part of the left crus down to its base.  The patient's fundus was quite adherent to the left diaphragmatic dome.  With this, we had circumferential mobilization.    We placed the stomach and esophagus on axial tension.  I then did a Type II mediastinal dissection where I freed the esophagus from its attachments to the aorta, spine, pleura, and pericardium using primarily gentle blunt as well as focused ultrasonic dissection.  We saw the anterior & posterior vagus nerves intact.  We preserved it at all times.  I procedded to dissect about 16 cm proximally into the mediastinum.  With that I could straighten out the esophagus and get 4 cm of intra-abdominal length of the esophagus at a best estimation.  I freed the anterior mediastinal sac off the esophagus & stomach.  We saw the anterior vagus nerve and freed the sac off of the vagus.  I dissected out & removed the fatty  epiphrenic pads at the esophagogastric junction. With that, I could better define the esophagogastric junction.  I confirmed the the patient had 4 cm of intra-abdominal esophageal length off tension.  I brought the fundus of the stomach posterior  to the esophagus over to the right side.  The wrap was mobile with the classic shoe shine maneuver.  Wrap became together gently.  We reflected the stomach left laterally and closed the esophageal hiatus using 0 Ethibond stitch using horizontal mattress stitches with pledgets on both sides.  I did that x3 stitches.  The crura had good substance and they came together well without any tension.  I reset up the and Nissen wrap and did a posterior gastropexy by taking of #1 Ethibond stitches to the posterior part of the right side of the wrap and thru the crural closure and tied that down for good posterior  gastropexy up against the hiatal closure.  I then did a left anterior gastropexy taking the apex of the left side of the wrap and tacked it to the left anterior crura just posterior to the phrenic vein. I tied that down.  This helped the wrap for completion.    Next, Anesthesia passed a 56-French bougie transorally.  It was a lighted bougie and we did do this under axial tension.  It passed down easily without resistance.    I then did a classic 2 cm Nissen fundoplication on the true esophagus above the cardia using Ethibond stitch in the left side of the wrap, then anterior esophagus, and then right side of the wrap and tied that down. Did 3 stitches.  I measured it and it was 2 cm in length.  We removed the bougie.  It was intact.    The wrap was soft and floppy.  I did irrigation and ensured hemostasis.  I saw no evidence of any leak or perforation or other abnormality.    Because of her significant delayed gastric emptying, I proceeded with pyloroplasty.  We could see a hypertrophic pyloric sphincter.  I used the Harmonic scalpel to longitunally into the duodenal bulb On the antimesenteric border anteriorly just distal to the pyloric sphincter.  I  came back proximally to completely transect across the pylorus muscle.  I came more proximally on the stomach another 1.5 cm until everything was wide open.  I closed the pyloroplasty transversely using 2-0 V-lockserrated running laparoscopic sutures from each corner in a Medulla fashion.  This help provide excellent imbrication.  We did meticulous inspection of the closure and found no leaks or abnormalities.  Brought up a swath of omentum over the closure and secured at down using interrupted Ethibond suture to help patch over the closure as an omentopexy.  We had anesthesia pass a nasogastric tube into the stomach and placed on suction.  I did copious irrigation assured hemostasis.  I removed the [facility] liver retractor under direct visualization.  I  evacuated carbon dioxide and removed the ports.  The skin was closed with Monocryl and sterile dressings applied.  The patient is being extubated and brought back to the recovery room.  I discussed postop care in detail with the patient and family in in the office.  Discussed again with the patient in the holding area.  I discussed Intraoperative findings and intervention with the patient's family.  Discussed postoperative care in the hospital and as an outpatient in detail.  Instructions are written.  Questions were answered.  Understanding and appreciation was expressed.

## 2014-03-18 NOTE — H&P (Signed)
[facility]  [address]  [place], [place] [address] Phone: [phone] FAX: [phone]     Theresa Shelton  Mar 17, 1976 [id number]  CARE TEAM:  PCP: Diamantina Providence., FNP  Outpatient Care Team: Patient Care Team: Reggie Pile. Dareen Piano, FNP as PCP - General (Nurse Practitioner) Theda Belfast, MD as Consulting Physician (Gastroenterology) Ardeth Sportsman, MD as Consulting Physician (General Surgery)  Inpatient Treatment Team: Treatment Team: Attending Provider: Ardeth Sportsman, MD  This patient is a 38 y.o.female who presents today for surgical evaluation.   Reason for evaluation: Surgery  Pleasant white female does struggle with heartburn and reflux for 15 years. She had chronic burning to the back of her throat. Poor food tolerance. Ice chips/water. Saw commercial concern for reflux. Mentioned her primary care physician. Started on an acid medicines. She has been on most H2 blockers and proton pump inhibitors since that time. Usually a medicine will help for 6 months. Then things will dwindle in effectiveness. Medicines will be increased or switched. Then that seems to help for 3-6 motnhs. She is made major diet modifications. She stopped soda, fast food, caffeine, chocolate. She is on a more organic Paleo type diet. Those have helped. She is intentionally lost 40 pounds of weight. However Things have progressive [name] worsened and now the medicines, weight loss, and diet changes have Stopped being in a more effective and she still struggles.  She still struggles with reflux. Could not tolerate lying flat or bending over unless has her medicines. Symptoms get markedly worse if she misses a dose. She has had a couple his episodes of severe epigastric and chest pain. Felt not to be cardiac or pulmonary etiology. Normally hikes with her family 4-6 miles without problems. 3 bowel movements a day. No personal nor family history of GI/colon  cancer, inflammatory bowel disease, irritable bowel syndrome, allergy such as Celiac Sprue, dietary/dairy problems, colitis, ulcers nor gastritis. No recent sick contacts/gastroenteritis. No travel outside the country. No changes in diet. No dysphagia to solids or liquids. No hematochezia, hematemesis, coffee ground emesis. No evidence of prior gastric/peptic ulceration.   She saw gastroenterology. Dr. Elnoria Howard performed endoscopy 2 years ago. She recalls being told it was underwhelming. I do not have the report in front of me. Not able to get that record today. Dr. Elnoria Howard saw the patient again this year. On the significant acid suppression. Still struggling. Manometry performed over concerns of crampy chest pain perhaps being esophageal spasms. That is normal. Surgical consultation requested to see if benefit of fundoplication for better control of significant GERD.  I ordered a gastric emptying study. That showed delayed gastric emptying rather profound. She does confessto having bloating and early satiety. Claims to have a bowel movement every day with a very high-fiber diet. She did redo the Bravo study since the first one was on medicines. Ready for surgery.  Past Medical History  Diagnosis Date  . GERD (gastroesophageal reflux disease)   . Blood transfusion without reported diagnosis 23 yrs ago    self donation  . Arthritis   . Hip bursitis   . Scoliosis   . Elevated cholesterol   . Rosacea   . Jaundice, neonatal   . PONV (postoperative nausea and vomiting) 11-21-13  . Anxiety   . 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 Gastroparesis   . Family history of anesthesia complication     mother n/v    Past Surgical History  Procedure  Laterality Date  . Esophageal manometry [name] 09/09/2013    Procedure: ESOPHAGEAL MANOMETRY (EM);  Surgeon: Theda Belfast, MD;  Location: [facility];  Service: Endoscopy;  Laterality: [name];  . Spinal fusion  1992    lower back harrington rods  . Tonsillectomy    .  Hand tendon surgery Left 2008    Reattachment  . Wisdom tooth extraction    . Esophagogastroduodenoscopy [name] 11/21/2013    Procedure: ESOPHAGOGASTRODUODENOSCOPY (EGD);  Surgeon: Theda Belfast, MD;  Location: [facility];  Service: Endoscopy;  Laterality: [name];  . Bravo ph study [name] 11/21/2013    Procedure: BRAVO PH STUDY;  Surgeon: Theda Belfast, MD;  Location: [facility];  Service: Endoscopy;  Laterality: [name];  . Esophagogastroduodenoscopy (egd) with propofol [name] 12/26/2013    Procedure: ESOPHAGOGASTRODUODENOSCOPY (EGD) WITH PROPOFOL;  Surgeon: Theda Belfast, MD;  Location: [facility];  Service: Endoscopy;  Laterality: [name];  . Bravo ph study [name] 12/26/2013    Procedure: BRAVO PH STUDY;  Surgeon: Theda Belfast, MD;  Location: [facility];  Service: Endoscopy;  Laterality: [name];    History   Social History  . Marital Status: Married    Spouse Name: [name]    Number of Children: [name]  . Years of Education: [name]   Occupational History  . Not on file.   Social History Main Topics  . Smoking status: Never Smoker   . Smokeless tobacco: Never Used  . Alcohol Use: Yes     Comment: 2-3 glasses of wine per month  . Drug Use: No  . Sexual Activity: Not on file   Other Topics Concern  . Not on file   Social History Narrative  . No narrative on file    History reviewed. No pertinent family history.  Current Facility-Administered Medications  Medication Dose Route Frequency Provider Last Rate Last Dose  . metroNIDAZOLE (FLAGYL) IVPB 500 mg  500 mg Intravenous On Call to OR Ardeth Sportsman, MD      . vancomycin (VANCOCIN) 1,500 mg in sodium chloride 0.9 % 500 mL IVPB  1,500 mg Intravenous On Call to OR Ardeth Sportsman, MD         Allergies  Allergen Reactions  . Amoxicillin Anaphylaxis  . Clindamycin/Lincomycin Anaphylaxis  . Other Anaphylaxis    Mango  . Papaya Derivatives Anaphylaxis  . Septra [Sulfamethoxazole-Tmp Ds] Anaphylaxis    ROS: Constitutional:  No fevers, chills,  sweats.  Weight stable Eyes:  No vision changes, No discharge HENT:  No sore throats, nasal drainage Lymph: No neck swelling, No bruising easily Pulmonary:  No cough, productive sputum CV: No orthopnea, PND  No exertional chest/neck/shoulder/arm pain. GI:  No personal nor family history of GI/colon cancer, inflammatory bowel disease, irritable bowel syndrome, allergy such as Celiac Sprue, dietary/dairy problems, colitis, ulcers nor gastritis.  No recent sick contacts/gastroenteritis.  No travel outside the country.  No changes in diet. Renal: No UTIs, No hematuria Genital:  No drainage, bleeding, masses Musculoskeletal: No severe joint pain.  Good ROM major joints Skin:  No sores or lesions.  No rashes Heme/Lymph:  No easy bleeding.  No swollen lymph nodes Neuro: No focal weakness/numbness.  No seizures Psych: No suicidal ideation.  No hallucinations  BP 120/77  Pulse 92  Temp(Src) 97.7 F (36.5 C) (Oral)  Resp 18  Ht  (1.6 m)  Wt 141 lb (63.957 kg)  BMI 24.98 kg/m2  SpO2 100%  LMP 03/04/2014  Physical Exam: General: Pt awake/alert/oriented x4 in  no major acute distress Eyes: PERRL, normal EOM. Sclera nonicteric Neuro: CN II-XII intact w/o focal sensory/motor deficits. Lymph: No head/neck/groin lymphadenopathy Psych:  No delerium/psychosis/paranoia HENT: Normocephalic, Mucus membranes moist.  No thrush Neck: Supple, No tracheal deviation Chest: No pain.  Good respiratory excursion. CV:  Pulses intact.  Regular rhythm Abdomen: Soft, Nondistended.  Nontender.  No incarcerated hernias. Ext:  SCDs BLE.  No significant edema.  No cyanosis Skin: No petechiae / purpurea.  No major sores Musculoskeletal: No severe joint pain.  Good ROM major joints   Results:   Labs: No results found for this or any previous visit (from the past 48 hour(s)).  Imaging / Studies: No results found.  Medications / Allergies: per chart  Antibiotics: Anti-infectives   Start     Dose/Rate  Route Frequency Ordered Stop   03/18/14 0652  metroNIDAZOLE (FLAGYL) IVPB 500 mg    Comments:  Pharmacy may adjust dosing strength, interval, or rate of medication as needed for optimal therapy for the patient Send with patient on call to the OR.  Anesthesia to complete antibiotic administration <1min prior to incision per [facility].   500 mg 100 mL/hr over 60 Minutes Intravenous On call to O.R. 03/18/14 1324 03/19/14 0559   03/18/14 0600  vancomycin (VANCOCIN) 1,500 mg in sodium chloride 0.9 % 500 mL IVPB    Comments:  Pharmacy may adjust dosing strength, interval, or rate of medication as needed for optimal therapy for the patient  Send with patient on call to the OR.  Anesthesia to complete antibiotic administration <47min prior to incision per [facility].   1,500 mg 250 mL/hr over 120 Minutes Intravenous On call to O.R. 03/17/14 1310 03/19/14 0559      Assessment  Theresa Shelton  38 y.o. female  Day of Surgery  Procedure(s): LAPAROSCOPIC NISSEN FUNDOPLICATION & pylorplasty  Problem List:  Active Problems:   * No active hospital problems. *   GERD by history and Bravo study  Profound delayed gastric emptying  Plan:  I think we have more objective proved to recommend Nissen fundoplication and pyloroplasty. She is interested in proceeding. I did discuss what fundoplication & pyloroplasty would entail:   The anatomy & physiology of the foregut and anti-reflux mechanism was discussed. The pathophysiology of GERD was discussed. Natural history risks without surgery was discussed. The patient's symptoms are not adequately controlled by medicines and other non-operative treatments. I feel the risks of no intervention will lead to serious problems that outweigh the operative risks; therefore, I recommended surgery to rebuild the anti-reflux valve and control reflux better. Need for a thorough workup to rule out the differential diagnosis and plan treatment was explained. I  explained laparoscopic techniques with possible need for an open approach. Given the profound gastroparesis, recommended pyloroplasty.  Risks such as bleeding, infection, abscess, leak, need for further treatment, heart attack, death, and other risks were discussed. I noted a good likelihood this will help address the problem. Goals of post-operative recovery were discussed as well. Possibility that this will not correct all symptoms was explained. Post-operative dysphagia, need for short-term liquid & pureed diet, inability to vomit, possibility of wrap slippage or herniation, possible need for medicines to help control symptoms in addition to surgery were discussed. Risk of dumping syndrome with high fat/sugary diets as well. We will work to minimize complications. Educational handouts further explaining the pathology, treatment options, and dysphagia diet was given as well. Questions were answered. The patient expresses understanding & wishes to  proceed with surgery.   -VTE prophylaxis- SCDs, etc -mobilize as tolerated to help recovery    Ardeth Sportsman, M.D., F.A.C.S. Gastrointestinal and Minimally Invasive Surgery [facility] [address], [address] [place], [place] [address] [phone] Main / Paging   03/18/2014  Note: Portions of this report may have been transcribed using voice recognition software. Every effort was made to ensure accuracy; however, inadvertent computerized transcription errors may be present.   Any transcriptional errors that result from this process are unintentional.

## 2014-03-18 NOTE — Addendum Note (Signed)
Addendum created 03/18/14 1512 by Paris Lore, CRNA   Modules edited: Anesthesia Medication Administration

## 2014-03-19 ENCOUNTER — Observation Stay (HOSPITAL_COMMUNITY): Payer: Medicaid Other

## 2014-03-19 ENCOUNTER — Encounter (HOSPITAL_COMMUNITY): Payer: Self-pay | Admitting: Surgery

## 2014-03-19 DIAGNOSIS — K219 Gastro-esophageal reflux disease without esophagitis: Secondary | ICD-10-CM | POA: Diagnosis not present

## 2014-03-19 DIAGNOSIS — M7070 Other bursitis of hip, unspecified hip: Secondary | ICD-10-CM | POA: Diagnosis present

## 2014-03-19 LAB — CBC
HEMATOCRIT: 36.2 % (ref 36.0–46.0)
Hemoglobin: 12.1 g/dL (ref 12.0–15.0)
MCH: 29.7 pg (ref 26.0–34.0)
MCHC: 33.4 g/dL (ref 30.0–36.0)
MCV: 88.9 fL (ref 78.0–100.0)
PLATELETS: 183 10*3/uL (ref 150–400)
RBC: 4.07 MIL/uL (ref 3.87–5.11)
RDW: 12.3 % (ref 11.5–15.5)
WBC: 11.1 10*3/uL — ABNORMAL HIGH (ref 4.0–10.5)

## 2014-03-19 LAB — BASIC METABOLIC PANEL
Anion gap: 10 (ref 5–15)
BUN: 7 mg/dL (ref 6–23)
CO2: 26 meq/L (ref 19–32)
CREATININE: 0.59 mg/dL (ref 0.50–1.10)
Calcium: 9 mg/dL (ref 8.4–10.5)
Chloride: 102 mEq/L (ref 96–112)
GFR calc Af Amer: >90 mL/min (ref >90)
GFR calc non Af Amer: >90 mL/min (ref >90)
GLUCOSE: 99 mg/dL (ref 70–99)
Potassium: 3.6 mEq/L — ABNORMAL LOW (ref 3.7–5.3)
SODIUM: 138 meq/L (ref 137–147)

## 2014-03-19 MED ORDER — SODIUM CHLORIDE 0.9 % IJ SOLN
3.0000 mL | Freq: Two times a day (BID) | INTRAMUSCULAR | Status: DC
  Administered 2014-03-20: 3 mL via INTRAVENOUS

## 2014-03-19 MED ORDER — SODIUM CHLORIDE 0.9 % IV SOLN: 250.0000 mL | INTRAVENOUS | Status: DC | PRN

## 2014-03-19 MED ORDER — SODIUM CHLORIDE 0.9 % IJ SOLN
3.0000 mL | INTRAMUSCULAR | Status: DC | PRN
  Administered 2014-03-20: 3 mL via INTRAVENOUS

## 2014-03-19 MED ORDER — IOHEXOL 300 MG/ML  SOLN
50.0000 mL | Freq: Once | INTRAMUSCULAR | Status: AC | PRN
  Administered 2014-03-19: 50 mL via ORAL

## 2014-03-19 MED ORDER — SODIUM CHLORIDE 0.9 % IJ SOLN
3.0000 mL | Freq: Two times a day (BID) | INTRAMUSCULAR | Status: DC
  Administered 2014-03-19 – 2014-03-20 (×2): 3 mL via INTRAVENOUS

## 2014-03-19 MED ORDER — PANTOPRAZOLE SODIUM 40 MG PO PACK
80.0000 mg | PACK | Freq: Every day | ORAL | Status: DC
  Administered 2014-03-19 – 2014-03-20 (×2): 80 mg via ORAL
  Filled 2014-03-19 (×4): qty 40

## 2014-03-19 MED ORDER — SODIUM CHLORIDE 0.9 % IJ SOLN: 3.0000 mL | INTRAMUSCULAR | Status: DC | PRN

## 2014-03-19 MED ORDER — SODIUM CHLORIDE 0.9 % IJ SOLN
3.0000 mL | INTRAMUSCULAR | Status: DC | PRN
Start: 2014-03-19 — End: 2014-03-21

## 2014-03-19 MED ORDER — SODIUM CHLORIDE 0.9 % IJ SOLN
3.0000 mL | Freq: Two times a day (BID) | INTRAMUSCULAR | Status: DC
  Administered 2014-03-19: 3 mL via INTRAVENOUS

## 2014-03-19 NOTE — Progress Notes (Signed)
UR Completed.  [name] [phone] 03/19/2014  

## 2014-03-19 NOTE — Progress Notes (Signed)
0645 removed foley and pt tolerated it well.  Awaiting first void

## 2014-03-19 NOTE — Progress Notes (Signed)
Walked a lap around floor at 2200 and tolerated it well

## 2014-03-19 NOTE — Progress Notes (Signed)
Theresa Shelton[address] [place], [place] [address] Phone: [phone] FAX: [place] [phone] 1976/03/18  CARE TEAM:  PCP: Vonna Drafts., FNP  Outpatient Care Team: Patient Care Team: Claris Gladden. Ouida Sills, [place] as PCP - General (Nurse Practitioner) Beryle Beams, MD as Consulting Physician (Gastroenterology) Adin Hector, MD as Consulting Physician (General Surgery)  Inpatient Treatment Team: Treatment Team: Attending Provider: Adin Hector, MD; Registered Nurse: Parke Simmers, RN   Subjective:  Tol sips Walking in hallway Many questions  Objective:  Vital signs:  Filed Vitals:   03/18/14 1838 03/18/14 2200 03/19/14 0126 03/19/14 0548  BP: 115/77 118/82 110/84 114/77  Pulse: 111 113 110 103  Temp: 98.3 F (36.8 C) 99.2 F (37.3 C) 98.1 F (36.7 C) 99.8 F (37.7 C)  TempSrc: Oral Oral Oral Oral  Resp: 16 16 16 16   Height:      Weight:      SpO2: 97% 97% 96% 99%    Last BM Date: 03/17/14  Intake/Output   Yesterday:  09/01 0701 - 09/02 0700 In: 3903.8 [P.O.:180; I.V.:3693.8; NG/GT:30] Out: 2683 [Urine:2480; Emesis/NG output:203] This shift:     Bowel function:  Flatus: y  BM: n  Drain: ngt bilious thin.  189m clamped x 4hours  Physical Exam:  General: Pt awake/alert/oriented x4 in no acute distress Eyes: PERRL, normal EOM.  Sclera clear.  No icterus Neuro: CN II-XII intact w/o focal sensory/motor deficits. Lymph: No head/neck/groin lymphadenopathy Psych:  No delerium/psychosis/paranoia HENT: Normocephalic, Mucus membranes moist.  No thrush Neck: Supple, No tracheal deviation Chest: No chest wall pain w good excursion CV:  Pulses intact.  Regular rhythm MS: Normal AROM mjr joints.  No obvious deformity Abdomen: Soft.  Nondistended.  Mildly tender at incisions only.  No evidence of peritonitis.  No incarcerated hernias. Ext:  SCDs BLE.  No mjr edema.  No  cyanosis Skin: No petechiae / purpura   Problem List:   Active Problems:   GERD (gastroesophageal reflux disease) s/p Nissen fundoplication 96/01/3418  Delayed gastric emptying s/p lap pyloroplasty 03/18/2014   Anxiety   Assessment  Theresa Shelton  38y.o. female  1 Day Post-Op  Procedure(s): POST-OPERATIVE DIAGNOSIS:  Gastroesophageal reflux factory to medical management  Profound delayed emptying  PROCEDURES:  1. LaparoscopicType II mediastinal dissection.  2. Anterior & posterior gastropexy.  3. Nissen fundoplication 2 cm over a 56-French bougie  4. Laparoscopic pyloroplasty  5. Laparoscopic omentopexy  SURGEON: Surgeon(s):  SAdin Hector MD  HAdin Hector MD - Asst   Stable  Plan:  -UGI Gastrograffin to eval Nissen & pyloroplasty - if WNL, d/c NGT & adv diet to pureed as tolerated -stop IVF w PRN boluses -anxiolysis -PPI -palliate other Sx, esp nausea -VTE prophylaxis- SCDs, etc -mobilize as tolerated to help recovery  I updated the patient's status to the patient.  Recommendations were made.  Questions were answered.  The patient expressed understanding & appreciation.   SAdin Hector M.D., F.A.C.S. Gastrointestinal and Minimally Invasive [facility] [address] [place] [phone] / Paging   03/19/2014   Results:   Labs: Results for orders placed during the hospital encounter of 03/18/14 (from the past 48 hour(s))  BASIC METABOLIC PANEL     Status: Abnormal   Collection Time    03/19/14  5:14 AM      Result Value Ref Range  Sodium 138  137 - 147 mEq/L   Potassium 3.6 (*) 3.7 - 5.3 mEq/L   Chloride 102  96 - 112 mEq/L   CO2 26  19 - 32 mEq/L   Glucose, Bld 99  70 - 99 mg/dL   BUN 7  6 - 23 mg/dL   Creatinine, Ser 0.59  0.50 - 1.10 mg/dL   Calcium 9.0  8.4 - 10.5 mg/dL   GFR calc non Af Amer >90  >90 mL/min   GFR calc Af Amer >90  >90 mL/min   Comment: (NOTE)      The eGFR has been calculated using the CKD EPI equation.     This calculation has not been validated in all clinical situations.     eGFR's persistently <90 mL/min signify possible Chronic Kidney     Disease.   Anion gap 10  5 - 15  CBC     Status: Abnormal   Collection Time    03/19/14  5:14 AM      Result Value Ref Range   WBC 11.1 (*) 4.0 - 10.5 K/uL   RBC 4.07  3.87 - 5.11 MIL/uL   Hemoglobin 12.1  12.0 - 15.0 g/dL   HCT 36.2  36.0 - 46.0 %   MCV 88.9  78.0 - 100.0 fL   MCH 29.7  26.0 - 34.0 pg   MCHC 33.4  30.0 - 36.0 g/dL   RDW 12.3  11.5 - 15.5 %   Platelets 183  150 - 400 K/uL    Imaging / Studies: No results found.  Medications / Allergies: per chart  Antibiotics: Anti-infectives   Start     Dose/Rate Route Frequency Ordered Stop   03/18/14 0652  metroNIDAZOLE (FLAGYL) IVPB 500 mg    Comments:  Pharmacy may adjust dosing strength, interval, or rate of medication as needed for optimal therapy for the patient Send with patient on call to the OR.  Anesthesia to complete antibiotic administration <80mn [facility]   500 mg 100 mL/hr over 60 Minutes Intravenous On call to O.R. 03/18/14 0469509/01/15 0725   03/18/14 0600  vancomycin (VANCOCIN) 1,500 mg in sodium chloride 0.9 % 500 mL IVPB    Comments:  Pharmacy may adjust dosing strength, interval, or rate of medication as needed for optimal therapy for the patient  Send with patient on call to the OR.  Anesthesia to complete antibiotic administration <630m prior to [facility]  1,500 mg 250 mL/hr over 120 Minutes Intravenous On call to O.R. 03/17/14 1310 03/18/14 0759       Note: Portions of this report may have been transcribed using voice recognition software. Every effort was made to ensure accuracy; however, inadvertent computerized transcription errors may be present.   Any transcriptional errors that result from this process are unintentional.

## 2014-03-19 NOTE — Progress Notes (Signed)
Patient iv not replaced at pt request.

## 2014-03-20 DIAGNOSIS — K219 Gastro-esophageal reflux disease without esophagitis: Secondary | ICD-10-CM | POA: Diagnosis not present

## 2014-03-20 MED ORDER — HYDROMORPHONE HCL PF 1 MG/ML IJ SOLN
0.5000 mg | INTRAMUSCULAR | Status: DC | PRN
  Administered 2014-03-20: 1 mg via INTRAVENOUS
  Administered 2014-03-21: 0.5 mg via INTRAVENOUS
  Filled 2014-03-20 (×2): qty 1

## 2014-03-20 MED ORDER — PSYLLIUM 95 % PO PACK
1.0000 | PACK | Freq: Two times a day (BID) | ORAL | Status: DC
  Administered 2014-03-20: 1 via ORAL
  Filled 2014-03-20 (×4): qty 1

## 2014-03-20 MED ORDER — METHOCARBAMOL 500 MG PO TABS: 500.0000 mg | ORAL_TABLET | Freq: Four times a day (QID) | ORAL | Status: DC | PRN

## 2014-03-20 MED ORDER — TAPENTADOL HCL 50 MG PO TABS: 50.0000 mg | ORAL_TABLET | ORAL | Status: DC | PRN

## 2014-03-20 MED ORDER — TAPENTADOL HCL 50 MG PO TABS
100.0000 mg | ORAL_TABLET | ORAL | Status: DC | PRN
  Administered 2014-03-20 – 2014-03-21 (×4): 100 mg via ORAL
  Filled 2014-03-20 (×4): qty 2

## 2014-03-20 MED ORDER — ACETAMINOPHEN 500 MG PO TABS
1000.0000 mg | ORAL_TABLET | Freq: Three times a day (TID) | ORAL | Status: DC
  Administered 2014-03-20 – 2014-03-21 (×2): 1000 mg via ORAL
  Filled 2014-03-20 (×5): qty 2

## 2014-03-20 NOTE — Progress Notes (Signed)
UR Completed.  [phone]  

## 2014-03-20 NOTE — Progress Notes (Signed)
[place] [place], [place] [address] Phone: [phone] FAX: [place] [phone] June 04, 1976  CARE TEAM:  PCP: Vonna Drafts., FNP  Outpatient Care Team: Patient Care Team: Claris Gladden. Ouida Sills, [place] as PCP - General (Nurse Practitioner) Beryle Beams, MD as Consulting Physician (Gastroenterology) Michael Boston, MD as Consulting Physician (General Surgery)  Inpatient Treatment Team: Treatment Team: Attending Provider: Michael Boston, MD; Registered Nurse: Parke Simmers, RN; Registered Nurse: Eugenie Birks, RN; Registered Nurse: Roma Schanz, RN   Subjective:  Tol liquids Walking in hallway PO meds not enough - using IV dilaudid Many questions  Objective:  Vital signs:  Filed Vitals:   03/19/14 0548 03/19/14 1413 03/19/14 2211 03/20/14 0610  BP: 114/77 117/90 116/87 123/76  Pulse: 103 123 103 96  Temp: 99.8 F (37.7 C) 99 F (37.2 C) 98.4 F (36.9 C) 98.1 F (36.7 C)  TempSrc: Oral Oral Oral Oral  Resp: _0 Height:      Weight:      SpO2: 99% 96% 94% 99%    Last BM Date: 03/17/14  Intake/Output   Yesterday:  09/02 0701 - 09/03 0700 In: 1800 [P.O.:1680] Out: 3950 [Urine:3950] This shift:     Bowel function:  Flatus: y  BM: n  Drain: ngt out Physical Exam:  General: Pt awake/alert/oriented x4 in no acute distress Eyes: PERRL, normal EOM.  Sclera clear.  No icterus Neuro: CN II-XII intact w/o focal sensory/motor deficits. Lymph: No head/neck/groin lymphadenopathy Psych:  No delerium/psychosis/paranoia HENT: Normocephalic, Mucus membranes moist.  No thrush Neck: Supple, No tracheal deviation Chest: No chest wall pain w good excursion CV:  Pulses intact.  Regular rhythm MS: Normal AROM mjr joints.  No obvious deformity Abdomen: Soft.  Nondistended.  Mildly tender at incisions only.  No evidence of peritonitis.  No incarcerated  hernias. Ext:  SCDs BLE.  No mjr edema.  No cyanosis Skin: No petechiae / purpura   Problem List:   Active Problems:   GERD (gastroesophageal reflux disease) s/p Nissen fundoplication 10/16/9620   Delayed gastric emptying s/p lap pyloroplasty 03/18/2014   Anxiety   Hip bursitis   Assessment  Theresa Shelton  38 y.o. female  2 Days Post-Op  Procedure(s): POST-OPERATIVE DIAGNOSIS:  Gastroesophageal reflux factory to medical management  Profound delayed emptying  PROCEDURES:  1. LaparoscopicType II mediastinal dissection.  2. Anterior & posterior gastropexy.  3. Nissen fundoplication 2 cm over a 56-French bougie  4. Laparoscopic pyloroplasty  5. Laparoscopic omentopexy  SURGEON: Surgeon(s):  Adin Hector, MD  Adin Hector, MD - Asst   Stable  Plan:  -inc PO pain control above usual chronic pain dose. -follow off IVF w PRN boluses -anxiolysis -PPI -palliate other Sx, esp nausea -VTE prophylaxis- SCDs, etc -mobilize as tolerated to help recovery  D/C patient from hospital when patient meets criteria (anticipate in 1-2 day(s)):  Tolerating oral intake well Ambulating in walkways Adequate pain control without IV medications Urinating  Having flatus  I updated the patient's status to the patient.  Recommendations were made.  Questions were answered.  The patient expressed understanding & appreciation.   Adin Hector, M.D., F.A.C.S. Gastrointestinal and Minimally Invasive Surgery [facility] [address], [place], [place] [address] [phone] Main / Paging   03/20/2014   Results:   Labs: Results for orders placed during the hospital encounter of 03/18/14 (from the past  48 hour(s))  BASIC METABOLIC PANEL     Status: Abnormal   Collection Time    03/19/14  5:14 AM      Result Value Ref Range   Sodium 138  137 - 147 mEq/L   Potassium 3.6 (*) 3.7 - 5.3 mEq/L   Chloride 102  96 - 112 mEq/L   CO2 26  19 - 32 mEq/L    Glucose, Bld 99  70 - 99 mg/dL   BUN 7  6 - 23 mg/dL   Creatinine, Ser 0.59  0.50 - 1.10 mg/dL   Calcium 9.0  8.4 - 10.5 mg/dL   GFR calc non Af Amer >90  >90 mL/min   GFR calc Af Amer >90  >90 mL/min   Comment: (NOTE)     The eGFR has been calculated using the CKD EPI equation.     This calculation has not been validated in all clinical situations.     eGFR's persistently <90 mL/min signify possible Chronic Kidney     Disease.   Anion gap 10  5 - 15  CBC     Status: Abnormal   Collection Time    03/19/14  5:14 AM      Result Value Ref Range   WBC 11.1 (*) 4.0 - 10.5 K/uL   RBC 4.07  3.87 - 5.11 MIL/uL   Hemoglobin 12.1  12.0 - 15.0 g/dL   HCT 36.2  36.0 - 46.0 %   MCV 88.9  78.0 - 100.0 fL   MCH 29.7  26.0 - 34.0 pg   MCHC 33.4  30.0 - 36.0 g/dL   RDW 12.3  11.5 - 15.5 %   Platelets 183  150 - 400 K/uL    Imaging / Studies: Dg Ugi W/water Sol Cm  03/19/2014   CLINICAL DATA:  Status post Nissen fundoplication and pyloroplasty.  EXAM: WATER SOLUBLE UPPER GI SERIES  TECHNIQUE: Single-column upper GI series was performed using water soluble contrast.  CONTRAST:  70m OMNIPAQUE IOHEXOL 300 MG/ML  SOLN  COMPARISON:  None.  FLUOROSCOPY TIME:  2 min 5 seconds  FINDINGS: On the scout radiograph there is moderate gaseous distension of the stomach. Gas and stool noted within normal caliber large bowel. Nasogastric tube is identified within the stomach and the side port is below the GE junction. Scoliosis rods are noted within the lower thoracic and lumbar spine.  In the semi-upright orientation the patient ingested water-soluble contrast material. The contrast material passes through the fundoplication wrap into the distended stomach. The patient was then placed in the RAO orientation an emptying of the stomach into the duodenum was documented.  No extravasation of contrast material identified to suggest leak.  IMPRESSION: 1. Patent fundoplication wrap 2. Patent pylorus status post pyloroplasty.  No evidence for leak at the pyloroplasty site.   Electronically Signed   By: TKerby MoorsM.D.   On: 03/19/2014 11:02    Medications / Allergies: per chart  Antibiotics: Anti-infectives   Start     Dose/Rate Route Frequency Ordered Stop   03/18/14 0652  metroNIDAZOLE (FLAGYL) IVPB 500 mg    Comments:  Pharmacy may adjust dosing strength, interval, or rate of medication as needed for optimal therapy for the patient Send with patient on call to the OR.  Anesthesia to complete antibiotic administration <661m prior [facility]  500 mg 100 mL/hr over 60 Minutes Intravenous On call to O.R. 03/18/14 0635369/01/15 0725   03/18/14 0600  vancomycin (  VANCOCIN) 1,500 mg in sodium chloride 0.9 % 500 mL IVPB    Comments:  Pharmacy may adjust dosing strength, interval, or rate of medication as needed for optimal therapy for the patient  Send with patient on call to the OR.  Anesthesia to complete antibiotic administration <59mn [facility]   1,500 mg 250 mL/hr over 120 Minutes Intravenous On call to O.R. 03/17/14 1310 03/18/14 0759       Note: Portions of this report may have been transcribed using voice recognition software. Every effort was made to ensure accuracy; however, inadvertent computerized transcription errors may be present.   Any transcriptional errors that result from this process are unintentional.

## 2014-03-21 DIAGNOSIS — K219 Gastro-esophageal reflux disease without esophagitis: Secondary | ICD-10-CM | POA: Diagnosis not present

## 2014-03-21 MED ORDER — METHOCARBAMOL 500 MG PO TABS: 500.0000 mg | ORAL_TABLET | Freq: Four times a day (QID) | ORAL | Status: AC | PRN

## 2014-03-21 MED ORDER — TAPENTADOL HCL 50 MG PO TABS: 50.0000 mg | ORAL_TABLET | Freq: Four times a day (QID) | ORAL | Status: DC | PRN

## 2014-03-21 MED ORDER — DIPHENHYDRAMINE HCL 12.5 MG/5ML PO ELIX
25.0000 mg | ORAL_SOLUTION | Freq: Once | ORAL | Status: AC
  Administered 2014-03-21: 25 mg via ORAL
  Filled 2014-03-21: qty 10

## 2014-03-21 MED ORDER — TAPENTADOL HCL 50 MG PO TABS: 50.0000 mg | ORAL_TABLET | Freq: Four times a day (QID) | ORAL | Status: AC | PRN

## 2014-03-21 NOTE — Progress Notes (Signed)
Discharge instructions and prescriptions given to patient.  Questions answered,  Patient has slight rash on chest given Benadryl this am.  Patient states she gets rashes frequently at home and expressed no concerns at this time.  Patient awaits mothers arrival for a shower and to eat lunch before discharge

## 2014-03-21 NOTE — Care Management Note (Signed)
    Page 1 of 1   03/21/2014     3:24:40 PM CARE MANAGEMENT NOTE 03/21/2014  Patient:  Theresa Shelton, Theresa Shelton   Account Number:  [id number]  Date Initiated:  03/21/2014  Documentation initiated by:  Lanier Clam  Subjective/Objective Assessment:   38 Y/O F ADMITTED W/GERD.     Action/Plan:   FROM HOME.   Anticipated DC Date:  03/21/2014   Anticipated DC Plan:  HOME/SELF CARE      DC Planning Services  CM consult      Choice offered to / List presented to:             Status of service:  In process, will continue to follow Medicare Important Message given?   (If response is "NO", the following Medicare IM given date fields will be blank) Date Medicare IM given:   Medicare IM given by:   Date Additional Medicare IM given:   Additional Medicare IM given by:    Discharge Disposition:  HOME/SELF CARE  Per UR Regulation:  Reviewed for med. necessity/level of care/duration of stay  If discussed at Long Length of Stay Meetings, dates discussed:    Comments:  03/21/14 Crecencio Kwiatek RN,BSN NCM [phone] D/C HOME NO NEEDS OR ORDERS.

## 2014-03-21 NOTE — Discharge Summary (Signed)
Physician Discharge Summary  Patient ID: Theresa Shelton MRN: [id number] DOB/AGE: 16-Feb-1976 38 y.o.  Admit date: 03/18/2014 Discharge date: 03/21/2014  Admission Diagnoses:  Discharge Diagnoses:  Active Problems:   GERD (gastroesophageal reflux disease) s/p Nissen fundoplication 11/22/3218   Delayed gastric emptying s/p lap pyloroplasty 03/18/2014   Anxiety   Hip bursitis  03/18/2014  11:03 AM  PATIENT: Theresa Shelton 38 y.o. female  Patient Care Team:  Claris Gladden. Ouida Sills, [place] as PCP - General (Nurse Practitioner)  Beryle Beams, MD as Consulting Physician (Gastroenterology)  Adin Hector, MD as Consulting Physician (General Surgery)  PRE-OPERATIVE DIAGNOSIS: gastroesophageal reflux factory to medical management  POST-OPERATIVE DIAGNOSIS:  Gastroesophageal reflux factory to medical management  Profound delayed emptying  PROCEDURES:  1. LaparoscopicType II mediastinal dissection.  2. Anterior & posterior gastropexy.  3. Nissen fundoplication 2 cm over a 56-French bougie  4. Laparoscopic pyloroplasty  5. Laparoscopic omentopexy  SURGEON: Surgeon(s):  Adin Hector, MD  Adin Hector, MD - Asst     Discharged Condition: good  Hospital Course: The patient underwent the surgery above.  Postoperatively, the patient gradually mobilized in the hallways and advanced to a solid diet.  Pain and other symptoms  were treated aggressively.  Mild sore throat, itching, left wrist IV site soreness - improved by day of d/c.  By the time of discharge, the patient was walking well the hallways, eating pureed food well, having flatus.  Pain was well-controlled on an oral regimen.  Based on meeting discharge criteria and continuing to recover, I felt it was safe for the patient to be discharged from the hospital with close followup.  Instructions were discussed in detail.  They are written as well.     Consults: None  Significant Diagnostic Studies:   Dg Ugi W/water Sol Cm  03/19/2014    CLINICAL DATA:  Status post Nissen fundoplication and pyloroplasty.  EXAM: WATER SOLUBLE UPPER GI SERIES  TECHNIQUE: Single-column upper GI series was performed using water soluble contrast.  CONTRAST:  48m OMNIPAQUE IOHEXOL 300 MG/ML  SOLN  COMPARISON:  None.  FLUOROSCOPY TIME:  2 min 5 seconds  FINDINGS: On the scout radiograph there is moderate gaseous distension of the stomach. Gas and stool noted within normal caliber large bowel. Nasogastric tube is identified within the stomach and the side port is below the GE junction. Scoliosis rods are noted within the lower thoracic and lumbar spine.  In the semi-upright orientation the patient ingested water-soluble contrast material. The contrast material passes through the fundoplication wrap into the distended stomach. The patient was then placed in the RAO orientation an emptying of the stomach into the duodenum was documented.  No extravasation of contrast material identified to suggest leak.  IMPRESSION: 1. Patent fundoplication wrap 2. Patent pylorus status post pyloroplasty. No evidence for leak at the pyloroplasty site.   Electronically Signed   By: TKerby MoorsM.D.   On: 03/19/2014 11:02   Results for orders placed during the hospital encounter of 03/18/14 (from the past 72 hour(s))  BASIC METABOLIC PANEL     Status: Abnormal   Collection Time    03/19/14  5:14 AM      Result Value Ref Range   Sodium 138  137 - 147 mEq/L   Potassium 3.6 (*) 3.7 - 5.3 mEq/L   Chloride 102  96 - 112 mEq/L   CO2 26  19 - 32 mEq/L   Glucose, Bld 99  70 - 99 mg/dL   BUN  7  6 - 23 mg/dL   Creatinine, Ser 0.59  0.50 - 1.10 mg/dL   Calcium 9.0  8.4 - 10.5 mg/dL   GFR calc non Af Amer >90  >90 mL/min   GFR calc Af Amer >90  >90 mL/min   Comment: (NOTE)     The eGFR has been calculated using the CKD EPI equation.     This calculation has not been validated in all clinical situations.     eGFR's persistently <90 mL/min signify possible Chronic Kidney      Disease.   Anion gap 10  5 - 15  CBC     Status: Abnormal   Collection Time    03/19/14  5:14 AM      Result Value Ref Range   WBC 11.1 (*) 4.0 - 10.5 K/uL   RBC 4.07  3.87 - 5.11 MIL/uL   Hemoglobin 12.1  12.0 - 15.0 g/dL   HCT 36.2  36.0 - 46.0 %   MCV 88.9  78.0 - 100.0 fL   MCH 29.7  26.0 - 34.0 pg   MCHC 33.4  30.0 - 36.0 g/dL   RDW 12.3  11.5 - 15.5 %   Platelets 183  150 - 400 K/uL    Treatments: surgery (above)  Discharge Exam: Blood pressure 132/80, pulse 77, temperature 98.6 F (37 C), temperature source Oral, resp. rate 16, height _0  (1.6 m), weight 141 lb (63.957 kg), last menstrual period 03/04/2014, SpO2 96.00%.  General: Pt awake/alert/oriented x4 in no acute distress  Eyes: PERRL, normal EOM. Sclera clear. No icterus  Neuro: CN II-XII intact w/o focal sensory/motor deficits.  Lymph: No head/neck/groin lymphadenopathy  Psych: No delerium/psychosis/paranoia  HENT: Normocephalic, Mucus membranes moist. No thrush.  No hoarseness Neck: Supple, No tracheal deviation  Chest: No chest wall pain w good excursion  CV: Pulses intact. Regular rhythm  MS: Normal AROM mjr joints. No obvious deformity  Abdomen: Soft. Nondistended. Mildly tender at incisions only. No evidence of peritonitis. No incarcerated hernias.  Ext: SCDs BLE. No mjr edema. No cyanosis  Skin: No petechiae / purpura.  Mild rash LUQ abdomen w scratch marks - less   Disposition: 01-Home or Self Care  Discharge Instructions   Call MD for:  extreme fatigue    Complete by:  As directed      Call MD for:  hives    Complete by:  As directed      Call MD for:  persistant nausea and vomiting    Complete by:  As directed      Call MD for:  redness, tenderness, or signs of infection (pain, swelling, redness, odor or green/yellow discharge around incision site)    Complete by:  As directed      Call MD for:  severe uncontrolled pain    Complete by:  As directed      Call MD for:    Complete by:  As  directed   Temperature > 101.76F     Diet - low sodium heart healthy    Complete by:  As directed      Discharge instructions    Complete by:  As directed   Please see discharge instruction sheets.  Also refer to handout given an office.  Please call our office if you have any questions or concerns [phone]     Discharge wound care:    Complete by:  As directed   If you have closed incisions, shower and bathe over  these incisions with soap and water every day.  Remove all surgical dressings on postoperative day #3.  You do not need to replace dressings over the closed incisions unless you feel more comfortable with a Band-Aid covering it.   If you have an open wound that requires packing, please see wound care instructions.  In general, remove all dressings, wash wound with soap and water and then replace with saline moistened gauze.  Do the dressing change at least every day.  Please call our office [phone] if you have further questions.     Driving Restrictions    Complete by:  As directed   No driving until off narcotics and can safely swerve away without pain during an emergency     Increase activity slowly    Complete by:  As directed   Walk an hour a day.  Use 20-30 minute walks.  When you can walk 30 minutes without difficulty, increase to low impact/moderate activities such as biking, jogging, swimming, sexual activity..  Eventually can increase to unrestricted activity when not feeling pain.  If you feel pain: STOP!Marland Kitchen   Let pain protect you from overdoing it.  Use ice/heat/over-the-counter pain medications to help minimize his soreness.  Use pain prescriptions as needed to remain active.  It is better to take extra pain medications and be more active than to stay bedridden to avoid all pain medications.     Lifting restrictions    Complete by:  As directed   Avoid heavy lifting initially.  Do not push through pain.  You have no specific weight limit.  Coughing and sneezing or  four more stressful to your incision than any lifting you will do. Pain will protect you from injury.  Therefore, avoid intense activity until off all narcotic pain medications.  Coughing and sneezing or four more stressful to your incision than any lifting he will do.     May shower / Bathe    Complete by:  As directed      May walk up steps    Complete by:  As directed      Sexual Activity Restrictions    Complete by:  As directed   Sexual activity as tolerated.  Do not push through pain.  Pain will protect you from injury.     Walk with assistance    Complete by:  As directed   Walk over an hour a day.  May use a walker/cane/companion to help with balance and stamina.            Medication List         amitriptyline 25 MG tablet  Commonly known as:  ELAVIL  Take 25 mg by mouth at bedtime as needed (takes at bedtime if she is getting a migraine).     cetirizine 10 MG tablet  Commonly known as:  ZYRTEC  Take 10 mg by mouth daily as needed (Itching).     diclofenac 1.3 % Ptch  Commonly known as:  FLECTOR  Place 1 patch onto the skin 2 (two) times daily.     EPIPEN 0.3 mg/0.3 mL Soaj injection  Generic drug:  EPINEPHrine  Inject 0.3 mg into the muscle as needed (Allergic reaction).     meloxicam 7.5 MG tablet  Commonly known as:  MOBIC  Take 7.5 mg by mouth 2 (two) times daily.     metoCLOPramide 10 MG tablet  Commonly known as:  REGLAN  Take 1 tablet (10 mg total) by mouth every  6 (six) hours as needed for nausea (nausea / vomiting).     omeprazole 40 MG capsule  Commonly known as:  PRILOSEC  Take 40 mg by mouth daily.     oxyCODONE 5 MG immediate release tablet  Commonly known as:  Oxy IR/ROXICODONE  Take 1-2 tablets (5-10 mg total) by mouth every 4 (four) hours as needed for moderate pain, severe pain or breakthrough pain.     PARoxetine 10 MG tablet  Commonly known as:  PAXIL  Take 5 mg by mouth at bedtime.     promethazine 25 MG suppository  Commonly known  as:  PHENERGAN  Place 1 suppository (25 mg total) rectally every 6 (six) hours as needed for nausea.     tapentadol 50 MG Tabs tablet  Commonly known as:  NUCYNTA  Take 50 mg by mouth every 6 (six) hours as needed for moderate pain.           Follow-up Information   Follow up with Davison Ohms C., MD In 3 weeks. (To follow up after your operation, To follow up after your hospital stay)    Specialty:  General Surgery   Contact information:   [address] [place] [address] [phone]       Signed: Adin Hector. 03/21/2014, 7:44 AM

## 2014-03-21 NOTE — Progress Notes (Signed)
UR Completed.  [phone]  

## 2014-03-22 ENCOUNTER — Telehealth (INDEPENDENT_AMBULATORY_CARE_PROVIDER_SITE_OTHER): Payer: Self-pay | Admitting: Surgery

## 2014-03-22 NOTE — Telephone Encounter (Signed)
Theresa Shelton  05/05/1976 [id number]  Patient Care Team: Reggie Pile. Dareen Piano, FNP as PCP - General (Nurse Practitioner) Theda Belfast, MD as Consulting Physician (Gastroenterology) Karie Soda, MD as Consulting Physician (General Surgery)  This patient is a 38 y.o.female who calls today for surgical evaluation.   Date of procedure/visit: 03/18/2014  Surgery:   POST-OPERATIVE DIAGNOSIS:  Gastroesophageal reflux factory to medical management  Profound delayed emptying  PROCEDURES:  1. LaparoscopicType II mediastinal dissection.  2. Anterior & posterior gastropexy.  3. Nissen fundoplication 2 cm over a 56-French bougie  4. Laparoscopic pyloroplasty  5. Laparoscopic omentopexy  SURGEON: Surgeon(s):  Ardeth Sportsman, MD  Ernestene Mention, MD - Asst   Reason for call: Rash  Pt left message about having a rash.  I called her.  No answer.  Left message for her to call back  Patient Active Problem List   Diagnosis Date Noted  . Hip bursitis   . Delayed gastric emptying s/p lap pyloroplasty 03/18/2014 12/03/2013  . Anxiety   . GERD (gastroesophageal reflux disease) s/p Nissen fundoplication 03/18/2014 09/27/2013    Past Medical History  Diagnosis Date  . GERD (gastroesophageal reflux disease)   . Blood transfusion without reported diagnosis 23 yrs ago    self donation  . Arthritis   . Hip bursitis   . Scoliosis   . Elevated cholesterol   . Rosacea   . Jaundice, neonatal   . PONV (postoperative nausea and vomiting) 11-21-13  . Anxiety   . 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 Gastroparesis   . Family history of anesthesia complication     mother n/v    Past Surgical History  Procedure Laterality Date  . Esophageal manometry [name] 09/09/2013    Procedure: ESOPHAGEAL MANOMETRY (EM);  Surgeon: Theda Belfast, MD;  Location: [facility];  Service: Endoscopy;  Laterality: [name];  . Spinal fusion  1992    lower back harrington rods  . Tonsillectomy    . Hand tendon surgery  Left 2008    Reattachment  . Wisdom tooth extraction    . Esophagogastroduodenoscopy [name] 11/21/2013    Procedure: ESOPHAGOGASTRODUODENOSCOPY (EGD);  Surgeon: Theda Belfast, MD;  Location: [facility];  Service: Endoscopy;  Laterality: [name];  . Bravo ph study [name] 11/21/2013    Procedure: BRAVO PH STUDY;  Surgeon: Theda Belfast, MD;  Location: [facility];  Service: Endoscopy;  Laterality: [name];  . Esophagogastroduodenoscopy (egd) with propofol [name] 12/26/2013    Procedure: ESOPHAGOGASTRODUODENOSCOPY (EGD) WITH PROPOFOL;  Surgeon: Theda Belfast, MD;  Location: [facility];  Service: Endoscopy;  Laterality: [name];  . Bravo ph study [name] 12/26/2013    Procedure: BRAVO PH STUDY;  Surgeon: Theda Belfast, MD;  Location: [facility];  Service: Endoscopy;  Laterality: [name];  . Laparoscopic nissen fundoplication [name] 03/18/2014    Procedure: LAPAROSCOPIC NISSEN FUNDOPLICATION & pylorplasty,omentapexy;  Surgeon: Karie Soda, MD;  Location: [facility];  Service: General;  Laterality: [name];    History   Social History  . Marital Status: Married    Spouse Name: [name]    Number of Children: [name]  . Years of Education: [name]   Occupational History  . Not on file.   Social History Main Topics  . Smoking status: Never Smoker   . Smokeless tobacco: Never Used  . Alcohol Use: Yes     Comment: 2-3 glasses of wine per month  . Drug Use: No  . Sexual Activity: Not on file   Other  Topics Concern  . Not on file   Social History Narrative  . No narrative on file    No family history on file.  Current Outpatient Prescriptions  Medication Sig Dispense Refill  . amitriptyline (ELAVIL) 25 MG tablet Take 25 mg by mouth at bedtime as needed (takes at bedtime if she is getting a migraine).      . cetirizine (ZYRTEC) 10 MG tablet Take 10 mg by mouth daily as needed (Itching).      Marland Kitchen diclofenac (FLECTOR) 1.3 % PTCH Place 1 patch onto the skin 2 (two) times daily.       Marland Kitchen EPINEPHrine (EPIPEN) 0.3 mg/0.3 mL IJ SOAJ  injection Inject 0.3 mg into the muscle as needed (Allergic reaction).      . meloxicam (MOBIC) 7.5 MG tablet Take 7.5 mg by mouth 2 (two) times daily.       . methocarbamol (ROBAXIN) 500 MG tablet Take 1-2 tablets (500-1,000 mg total) by mouth every 6 (six) hours as needed for muscle spasms.  30 tablet  1  . metoCLOPramide (REGLAN) 10 MG tablet Take 1 tablet (10 mg total) by mouth every 6 (six) hours as needed for nausea (nausea / vomiting).  20 tablet  3  . omeprazole (PRILOSEC) 40 MG capsule Take 40 mg by mouth daily.       Marland Kitchen PARoxetine (PAXIL) 10 MG tablet Take 5 mg by mouth at bedtime.       . promethazine (PHENERGAN) 25 MG suppository Place 1 suppository (25 mg total) rectally every 6 (six) hours as needed for nausea.  5 suppository  5  . tapentadol (NUCYNTA) 50 MG TABS tablet Take 1-2 tablets (50-100 mg total) by mouth every 6 (six) hours as needed for moderate pain or severe pain.  60 tablet  0   No current facility-administered medications for this visit.     Allergies  Allergen Reactions  . Amoxicillin Anaphylaxis  . Clindamycin/Lincomycin Anaphylaxis  . Other Anaphylaxis    Mango  . Papaya Derivatives Anaphylaxis  . Septra [Sulfamethoxazole-Tmp Ds] Anaphylaxis    @  Dg Ugi W/water Sol Cm  03/19/2014   CLINICAL DATA:  Status post Nissen fundoplication and pyloroplasty.  EXAM: WATER SOLUBLE UPPER GI SERIES  TECHNIQUE: Single-column upper GI series was performed using water soluble contrast.  CONTRAST:  50mL OMNIPAQUE IOHEXOL 300 MG/ML  SOLN  COMPARISON:  None.  FLUOROSCOPY TIME:  2 min 5 seconds  FINDINGS: On the scout radiograph there is moderate gaseous distension of the stomach. Gas and stool noted within normal caliber large bowel. Nasogastric tube is identified within the stomach and the side port is below the GE junction. Scoliosis rods are noted within the lower thoracic and lumbar spine.  In the semi-upright orientation the patient ingested water-soluble contrast material.  The contrast material passes through the fundoplication wrap into the distended stomach. The patient was then placed in the RAO orientation an emptying of the stomach into the duodenum was documented.  No extravasation of contrast material identified to suggest leak.  IMPRESSION: 1. Patent fundoplication wrap 2. Patent pylorus status post pyloroplasty. No evidence for leak at the pyloroplasty site.   Electronically Signed   By: Signa Kell M.D.   On: 03/19/2014 11:02    Note: This dictation was prepared with Dragon/digital dictation along with [employer]. Any transcriptional errors that result from this process are unintentional.

## 2014-03-22 NOTE — Telephone Encounter (Addendum)
Theresa Shelton  [birth year] [id number]  Patient Care Team: Reggie Pile. Dareen Piano, FNP as PCP - General (Nurse Practitioner) Theda Belfast, MD as Consulting Physician (Gastroenterology) Karie Soda, MD as Consulting Physician (General Surgery)  This patient is a 38 y.o.female who calls today for surgical evaluation.   Reason for call: Rash  Patient status post laparoscopic Nissen fundoplication and pyloroplasty.  She has many reactions to medications.  Sent home with pain, nausea, and muscle relaxant meds since has chronic pain, nausea & anxiety.  She calls concerned for a worsening rash on her abdomen and trunk.  Eating well.  No nausea or vomiting.  Most of the rashes are in the shape of some of the adhesive EKG leads and Tegaderm sites.  I recommend she try and remove the glue with some fingernail polish remover or go-be-gone, and then she should wash gently with soap to remove that.  She claimed that she could not afford anything so she will try and do some things at home first.  I recommended she continue the Benadryl and increase if needed.  She expressed understanding and appreciation.  I noted that if things were markedly worsening, she may need to be evaluated in emergency room since it is a Holiday weekend & [facility] office is closed.  ?Need for steroid taper if rash truly worse.  I doubt will come to that, but we will see  Patient Active Problem List   Diagnosis Date Noted  . Hip bursitis   . Delayed gastric emptying s/p lap pyloroplasty 03/18/2014 12/03/2013  . Anxiety   . GERD (gastroesophageal reflux disease) s/p Nissen fundoplication 03/18/2014 09/27/2013    Past Medical History  Diagnosis Date  . GERD (gastroesophageal reflux disease)   . Blood transfusion without reported diagnosis 23 yrs ago    self donation  . Arthritis   . Hip bursitis   . Scoliosis   . Elevated cholesterol   . Rosacea   . Jaundice, neonatal   . PONV (postoperative nausea and vomiting) 11-21-13  . Anxiety   .  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 Gastroparesis   . Family history of anesthesia complication     mother n/v    Past Surgical History  Procedure Laterality Date  . Esophageal manometry [name] 09/09/2013    Procedure: ESOPHAGEAL MANOMETRY (EM);  Surgeon: Theda Belfast, MD;  Location: [facility];  Service: Endoscopy;  Laterality: [name];  . Spinal fusion  1992    lower back harrington rods  . Tonsillectomy    . Hand tendon surgery Left 2008    Reattachment  . Wisdom tooth extraction    . Esophagogastroduodenoscopy [name] 11/21/2013    Procedure: ESOPHAGOGASTRODUODENOSCOPY (EGD);  Surgeon: Theda Belfast, MD;  Location: [facility];  Service: Endoscopy;  Laterality: [name];  . Bravo ph study [name] 11/21/2013    Procedure: BRAVO PH STUDY;  Surgeon: Theda Belfast, MD;  Location: [facility];  Service: Endoscopy;  Laterality: [name];  . Esophagogastroduodenoscopy (egd) with propofol [name] 12/26/2013    Procedure: ESOPHAGOGASTRODUODENOSCOPY (EGD) WITH PROPOFOL;  Surgeon: Theda Belfast, MD;  Location: [facility];  Service: Endoscopy;  Laterality: [name];  . Bravo ph study [name] 12/26/2013    Procedure: BRAVO PH STUDY;  Surgeon: Theda Belfast, MD;  Location: [facility];  Service: Endoscopy;  Laterality: [name];  . Laparoscopic nissen fundoplication [name] 03/18/2014    Procedure: LAPAROSCOPIC NISSEN FUNDOPLICATION & pylorplasty,omentapexy;  Surgeon: Karie Soda, MD;  Location: [facility];  Service: General;  Laterality: [name];    History   Social History  . Marital Status: Married    Spouse Name: [name]    Number of Children: [name]  . Years of Education: [name]   Occupational History  . Not on file.   Social History Main Topics  . Smoking status: Never Smoker   . Smokeless tobacco: Never Used  . Alcohol Use: Yes     Comment: 2-3 glasses of wine per month  . Drug Use: No  . Sexual Activity: Not on file   Other Topics Concern  . Not on file   Social History Narrative  . No narrative on file    No family  history on file.  Current Outpatient Prescriptions  Medication Sig Dispense Refill  . amitriptyline (ELAVIL) 25 MG tablet Take 25 mg by mouth at bedtime as needed (takes at bedtime if she is getting a migraine).      . cetirizine (ZYRTEC) 10 MG tablet Take 10 mg by mouth daily as needed (Itching).      Marland Kitchen diclofenac (FLECTOR) 1.3 % PTCH Place 1 patch onto the skin 2 (two) times daily.       Marland Kitchen EPINEPHrine (EPIPEN) 0.3 mg/0.3 mL IJ SOAJ injection Inject 0.3 mg into the muscle as needed (Allergic reaction).      . meloxicam (MOBIC) 7.5 MG tablet Take 7.5 mg by mouth 2 (two) times daily.       . methocarbamol (ROBAXIN) 500 MG tablet Take 1-2 tablets (500-1,000 mg total) by mouth every 6 (six) hours as needed for muscle spasms.  30 tablet  1  . metoCLOPramide (REGLAN) 10 MG tablet Take 1 tablet (10 mg total) by mouth every 6 (six) hours as needed for nausea (nausea / vomiting).  20 tablet  3  . omeprazole (PRILOSEC) 40 MG capsule Take 40 mg by mouth daily.       Marland Kitchen PARoxetine (PAXIL) 10 MG tablet Take 5 mg by mouth at bedtime.       . promethazine (PHENERGAN) 25 MG suppository Place 1 suppository (25 mg total) rectally every 6 (six) hours as needed for nausea.  5 suppository  5  . tapentadol (NUCYNTA) 50 MG TABS tablet Take 1-2 tablets (50-100 mg total) by mouth every 6 (six) hours as needed for moderate pain or severe pain.  60 tablet  0   No current facility-administered medications for this visit.     Allergies  Allergen Reactions  . Amoxicillin Anaphylaxis  . Clindamycin/Lincomycin Anaphylaxis  . Other Anaphylaxis    Mango  . Papaya Derivatives Anaphylaxis  . Septra [Sulfamethoxazole-Tmp Ds] Anaphylaxis    @  Dg Ugi W/water Sol Cm  03/19/2014   CLINICAL DATA:  Status post Nissen fundoplication and pyloroplasty.  EXAM: WATER SOLUBLE UPPER GI SERIES  TECHNIQUE: Single-column upper GI series was performed using water soluble contrast.  CONTRAST:  50mL OMNIPAQUE IOHEXOL 300 MG/ML  SOLN   COMPARISON:  None.  FLUOROSCOPY TIME:  2 min 5 seconds  FINDINGS: On the scout radiograph there is moderate gaseous distension of the stomach. Gas and stool noted within normal caliber large bowel. Nasogastric tube is identified within the stomach and the side port is below the GE junction. Scoliosis rods are noted within the lower thoracic and lumbar spine.  In the semi-upright orientation the patient ingested water-soluble contrast material. The contrast material passes through the fundoplication wrap into the distended stomach. The patient was then placed in the RAO orientation an emptying of the stomach into the  duodenum was documented.  No extravasation of contrast material identified to suggest leak.  IMPRESSION: 1. Patent fundoplication wrap 2. Patent pylorus status post pyloroplasty. No evidence for leak at the pyloroplasty site.   Electronically Signed   By: Signa Kell M.D.   On: 03/19/2014 11:02    Note: This dictation was prepared with Dragon/digital dictation along with [employer]. Any transcriptional errors that result from this process are unintentional.

## 2014-03-23 ENCOUNTER — Telehealth (INDEPENDENT_AMBULATORY_CARE_PROVIDER_SITE_OTHER): Payer: Self-pay | Admitting: General Surgery

## 2014-03-23 NOTE — Telephone Encounter (Signed)
She spoke with Dr. Michaell Cowing yesterday about a rash and he made some recommendations. Despite following his recommendations, the rash has continued to spread. She asked if she needed to go to the emergency department and I told her I would recommend that.

## 2014-03-24 ENCOUNTER — Encounter (HOSPITAL_COMMUNITY): Payer: Self-pay | Admitting: Emergency Medicine

## 2014-03-24 ENCOUNTER — Emergency Department (HOSPITAL_COMMUNITY)
Admission: EM | Admit: 2014-03-24 | Discharge: 2014-03-24 | Disposition: A | Discharge status: Home / Self Care | Payer: Medicaid Other | Attending: Emergency Medicine | Admitting: Emergency Medicine

## 2014-03-24 DIAGNOSIS — R110 Nausea: Secondary | ICD-10-CM | POA: Insufficient documentation

## 2014-03-24 DIAGNOSIS — L509 Urticaria, unspecified: Secondary | ICD-10-CM

## 2014-03-24 DIAGNOSIS — Z8639 Personal history of other endocrine, nutritional and metabolic disease: Secondary | ICD-10-CM | POA: Diagnosis not present

## 2014-03-24 DIAGNOSIS — Z79899 Other long term (current) drug therapy: Secondary | ICD-10-CM | POA: Insufficient documentation

## 2014-03-24 DIAGNOSIS — Z880 Allergy status to penicillin: Secondary | ICD-10-CM | POA: Diagnosis not present

## 2014-03-24 DIAGNOSIS — K219 Gastro-esophageal reflux disease without esophagitis: Secondary | ICD-10-CM | POA: Insufficient documentation

## 2014-03-24 DIAGNOSIS — R197 Diarrhea, unspecified: Secondary | ICD-10-CM | POA: Diagnosis not present

## 2014-03-24 DIAGNOSIS — Z8768 Personal history of other (corrected) conditions arising in the perinatal period: Secondary | ICD-10-CM | POA: Insufficient documentation

## 2014-03-24 DIAGNOSIS — M129 Arthropathy, unspecified: Secondary | ICD-10-CM | POA: Insufficient documentation

## 2014-03-24 DIAGNOSIS — Z87898 Personal history of other specified conditions: Secondary | ICD-10-CM | POA: Diagnosis not present

## 2014-03-24 DIAGNOSIS — Z791 Long term (current) use of non-steroidal anti-inflammatories (NSAID): Secondary | ICD-10-CM | POA: Diagnosis not present

## 2014-03-24 DIAGNOSIS — Z862 Personal history of diseases of the blood and blood-forming organs and certain disorders involving the immune mechanism: Secondary | ICD-10-CM | POA: Diagnosis not present

## 2014-03-24 DIAGNOSIS — L249 Irritant contact dermatitis, unspecified cause: Secondary | ICD-10-CM

## 2014-03-24 DIAGNOSIS — F411 Generalized anxiety disorder: Secondary | ICD-10-CM | POA: Insufficient documentation

## 2014-03-24 DIAGNOSIS — L508 Other urticaria: Secondary | ICD-10-CM | POA: Insufficient documentation

## 2014-03-24 DIAGNOSIS — Z3202 Encounter for pregnancy test, result negative: Secondary | ICD-10-CM | POA: Insufficient documentation

## 2014-03-24 DIAGNOSIS — L258 Unspecified contact dermatitis due to other agents: Secondary | ICD-10-CM | POA: Diagnosis not present

## 2014-03-24 LAB — URINALYSIS, ROUTINE W REFLEX MICROSCOPIC
BILIRUBIN URINE: NEGATIVE
Glucose, UA: NEGATIVE mg/dL
Hgb urine dipstick: NEGATIVE
Ketones, ur: NEGATIVE mg/dL
Leukocytes, UA: NEGATIVE
NITRITE: NEGATIVE
Protein, ur: NEGATIVE mg/dL
Specific Gravity, Urine: 1.008 (ref 1.005–1.030)
UROBILINOGEN UA: 0.2 mg/dL (ref 0.0–1.0)
pH: 6 (ref 5.0–8.0)

## 2014-03-24 LAB — COMPREHENSIVE METABOLIC PANEL
ALT: 29 U/L (ref 0–35)
AST: 25 U/L (ref 0–37)
Albumin: 3.9 g/dL (ref 3.5–5.2)
Alkaline Phosphatase: 63 U/L (ref 39–117)
Anion gap: 14 (ref 5–15)
BUN: 7 mg/dL (ref 6–23)
CALCIUM: 10 mg/dL (ref 8.4–10.5)
CO2: 23 meq/L (ref 19–32)
CREATININE: 0.6 mg/dL (ref 0.50–1.10)
Chloride: 99 mEq/L (ref 96–112)
GLUCOSE: 112 mg/dL — AB (ref 70–99)
Potassium: 4.3 mEq/L (ref 3.7–5.3)
Sodium: 136 mEq/L — ABNORMAL LOW (ref 137–147)
Total Bilirubin: 0.3 mg/dL (ref 0.3–1.2)
Total Protein: 7.8 g/dL (ref 6.0–8.3)

## 2014-03-24 LAB — CBC WITH DIFFERENTIAL/PLATELET
Basophils Absolute: 0 10*3/uL (ref 0.0–0.1)
Basophils Relative: 0 % (ref 0–1)
EOS PCT: 5 % (ref 0–5)
Eosinophils Absolute: 0.4 10*3/uL (ref 0.0–0.7)
HCT: 42.8 % (ref 36.0–46.0)
Hemoglobin: 15 g/dL (ref 12.0–15.0)
LYMPHS ABS: 1.9 10*3/uL (ref 0.7–4.0)
LYMPHS PCT: 24 % (ref 12–46)
MCH: 30.5 pg (ref 26.0–34.0)
MCHC: 35 g/dL (ref 30.0–36.0)
MCV: 87.2 fL (ref 78.0–100.0)
Monocytes Absolute: 0.6 10*3/uL (ref 0.1–1.0)
Monocytes Relative: 7 % (ref 3–12)
Neutro Abs: 5 10*3/uL (ref 1.7–7.7)
Neutrophils Relative %: 64 % (ref 43–77)
Platelets: 314 10*3/uL (ref 150–400)
RBC: 4.91 MIL/uL (ref 3.87–5.11)
RDW: 12.1 % (ref 11.5–15.5)
WBC: 7.9 10*3/uL (ref 4.0–10.5)

## 2014-03-24 LAB — PREGNANCY, URINE: PREG TEST UR: NEGATIVE

## 2014-03-24 LAB — LIPASE, BLOOD: LIPASE: 23 U/L (ref 11–59)

## 2014-03-24 MED ORDER — SODIUM CHLORIDE 0.9 % IV SOLN
1000.0000 mL | Freq: Once | INTRAVENOUS | Status: AC
  Administered 2014-03-24: 1000 mL via INTRAVENOUS

## 2014-03-24 MED ORDER — DIPHENHYDRAMINE HCL 50 MG/ML IJ SOLN
25.0000 mg | Freq: Once | INTRAMUSCULAR | Status: AC
  Administered 2014-03-24: 25 mg via INTRAVENOUS
  Filled 2014-03-24: qty 1

## 2014-03-24 MED ORDER — SODIUM CHLORIDE 0.9 % IV BOLUS (SEPSIS): 1000.0000 mL | Freq: Once | INTRAVENOUS | Status: DC

## 2014-03-24 MED ORDER — PREDNISONE 50 MG PO TABS: 50.0000 mg | ORAL_TABLET | Freq: Every day | ORAL | Status: AC

## 2014-03-24 MED ORDER — METHYLPREDNISOLONE SODIUM SUCC 125 MG IJ SOLR
125.0000 mg | Freq: Once | INTRAMUSCULAR | Status: AC
  Administered 2014-03-24: 125 mg via INTRAVENOUS
  Filled 2014-03-24: qty 2

## 2014-03-24 NOTE — Discharge Instructions (Signed)
Diarrhea Diarrhea is frequent loose and watery bowel movements. It can cause you to feel weak and dehydrated. Dehydration can cause you to become tired and thirsty, have a dry mouth, and have decreased urination that often is dark yellow. Diarrhea is a sign of another problem, most often an infection that will not last long. In most cases, diarrhea typically lasts 2-3 days. However, it can last longer if it is a sign of something more serious. It is important to treat your diarrhea as directed by your caregiver to lessen or prevent future episodes of diarrhea. CAUSES  Some common causes include:  Gastrointestinal infections caused by viruses, bacteria, or parasites.  Food poisoning or food allergies.  Certain medicines, such as antibiotics, chemotherapy, and laxatives.  Artificial sweeteners and fructose.  Digestive disorders. HOME CARE INSTRUCTIONS  Ensure adequate fluid intake (hydration): Have 1 cup (8 oz) of fluid for each diarrhea episode. Avoid fluids that contain simple sugars or sports drinks, fruit juices, whole milk products, and sodas. Your urine should be clear or pale yellow if you are drinking enough fluids. Hydrate with an oral rehydration solution that you can purchase at pharmacies, retail stores, and online. You can prepare an oral rehydration solution at home by mixing the following ingredients together:   - tsp table salt.   tsp baking soda.   tsp salt substitute containing potassium chloride.  1  tablespoons sugar.  1 L (34 oz) of water.  Certain foods and beverages may increase the speed at which food moves through the gastrointestinal (GI) tract. These foods and beverages should be avoided and include:  Caffeinated and alcoholic beverages.  High-fiber foods, such as raw fruits and vegetables, nuts, seeds, and whole grain breads and cereals.  Foods and beverages sweetened with sugar alcohols, such as xylitol, sorbitol, and mannitol.  Some foods may be well  tolerated and may help thicken stool including:  Starchy foods, such as rice, toast, pasta, low-sugar cereal, oatmeal, grits, baked potatoes, crackers, and bagels.  Bananas.  Applesauce.  Add probiotic-rich foods to help increase healthy bacteria in the GI tract, such as yogurt and fermented milk products.  Wash your hands well after each diarrhea episode.  Only take over-the-counter or prescription medicines as directed by your caregiver.  Take a warm bath to relieve any burning or pain from frequent diarrhea episodes. SEEK IMMEDIATE MEDICAL CARE IF:   You are unable to keep fluids down.  You have persistent vomiting.  You have blood in your stool, or your stools are black and tarry.  You do not urinate in 6-8 hours, or there is only a small amount of very dark urine.  You have abdominal pain that increases or localizes.  You have weakness, dizziness, confusion, or light-headedness.  You have a severe headache.  Your diarrhea gets worse or does not get better.  You have a fever or persistent symptoms for more than 2-3 days.  You have a fever and your symptoms suddenly get worse. MAKE SURE YOU:   Understand these instructions.  Will watch your condition.  Will get help right away if you are not doing well or get worse. Document Released: 06/24/2002 Document Revised: 11/18/2013 Document Reviewed: 03/11/2012 [facility] Patient Information 2015 [place], [place]. This information is not intended to replace advice given to you by your health care provider. Make sure you discuss any questions you have with your health care provider. Hives Hives are itchy, red, swollen areas of the skin. They can vary in size and location  on your body. Hives can come and go for hours or several days (acute hives) or for several weeks (chronic hives). Hives do not spread from person to person (noncontagious). They may get worse with scratching, exercise, and emotional stress. CAUSES   Allergic  reaction to food, additives, or drugs.  Infections, including the common cold.  Illness, such as vasculitis, lupus, or thyroid disease.  Exposure to sunlight, heat, or cold.  Exercise.  Stress.  Contact with chemicals. SYMPTOMS   Red or white swollen patches on the skin. The patches may change size, shape, and location quickly and repeatedly.  Itching.  Swelling of the hands, feet, and face. This may occur if hives develop deeper in the skin. DIAGNOSIS  Your caregiver can usually tell what is wrong by performing a physical exam. Skin or blood tests may also be done to determine the cause of your hives. In some cases, the cause cannot be determined. TREATMENT  Mild cases usually get better with medicines such as antihistamines. Severe cases may require an emergency epinephrine injection. If the cause of your hives is known, treatment includes avoiding that trigger.  HOME CARE INSTRUCTIONS   Avoid causes that trigger your hives.  Take antihistamines as directed by your caregiver to reduce the severity of your hives. Non-sedating or low-sedating antihistamines are usually recommended. Do not drive while taking an antihistamine.  Take any other medicines prescribed for itching as directed by your caregiver.  Wear loose-fitting clothing.  Keep all follow-up appointments as directed by your caregiver. SEEK MEDICAL CARE IF:   You have persistent or severe itching that is not relieved with medicine.  You have painful or swollen joints. SEEK IMMEDIATE MEDICAL CARE IF:   You have a fever.  Your tongue or lips are swollen.  You have trouble breathing or swallowing.  You feel tightness in the throat or chest.  You have abdominal pain. These problems may be the first sign of a life-threatening allergic reaction. Call your local emergency services (911 in [place] MAKE SURE YOU:   Understand these instructions.  Will watch your condition.  Will get help right away if you are  not doing well or get worse. Document Released: 07/04/2005 Document Revised: 07/09/2013 Document Reviewed: 09/27/2011 [facility] Patient Information 2015 [place], [place]. This information is not intended to replace advice given to you by your health care provider. Make sure you discuss any questions you have with your health care provider.

## 2014-03-24 NOTE — ED Notes (Signed)
Pt with recent sx on 03/18/2014 reports diarrhea x 3 days and hives that started when she was in the hospital that have become worse. Diarrhea started after she was d/c on Friday. "Friday until today has been constant diarrhea. Pt alert and oriented.

## 2014-03-24 NOTE — ED Provider Notes (Signed)
CSN: [id number]     Arrival date & time 03/24/14  1428 History   First MD Initiated Contact with Patient 03/24/14 1611     Chief Complaint  Patient presents with  . Diarrhea  . Urticaria     (Consider location/radiation/quality/duration/timing/severity/associated sxs/prior Treatment) Patient is a 38 y.o. female presenting with rash.  Rash Location: neck, periincisional. Quality: itchiness and redness   Severity:  Moderate Onset quality:  Gradual Duration:  3 days Timing:  Constant Progression:  Worsening Chronicity:  Recurrent Context comment:  Recent nissen Relieved by:  Nothing Worsened by:  Nothing tried Ineffective treatments:  Antihistamines Associated symptoms: diarrhea (since surgery) and nausea   Associated symptoms: no abdominal pain, no fever, no shortness of breath, no sore throat and not vomiting     Past Medical History  Diagnosis Date  . GERD (gastroesophageal reflux disease)   . Blood transfusion without reported diagnosis 23 yrs ago    self donation  . Arthritis   . Hip bursitis   . Scoliosis   . Elevated cholesterol   . Rosacea   . Jaundice, neonatal   . PONV (postoperative nausea and vomiting) 11-21-13  . Anxiety   . 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 Gastroparesis   . Family history of anesthesia complication     mother n/v   Past Surgical History  Procedure Laterality Date  . Esophageal manometry [name] 09/09/2013    Procedure: ESOPHAGEAL MANOMETRY (EM);  Surgeon: Theda Belfast, MD;  Location: [facility];  Service: Endoscopy;  Laterality: [name];  . Spinal fusion  1992    lower back harrington rods  . Tonsillectomy    . Hand tendon surgery Left 2008    Reattachment  . Wisdom tooth extraction    . Esophagogastroduodenoscopy [name] 11/21/2013    Procedure: ESOPHAGOGASTRODUODENOSCOPY (EGD);  Surgeon: Theda Belfast, MD;  Location: [facility];  Service: Endoscopy;  Laterality: [name];  . Bravo ph study [name] 11/21/2013    Procedure: BRAVO PH STUDY;   Surgeon: Theda Belfast, MD;  Location: [facility];  Service: Endoscopy;  Laterality: [name];  . Esophagogastroduodenoscopy (egd) with propofol [name] 12/26/2013    Procedure: ESOPHAGOGASTRODUODENOSCOPY (EGD) WITH PROPOFOL;  Surgeon: Theda Belfast, MD;  Location: [facility];  Service: Endoscopy;  Laterality: [name];  . Bravo ph study [name] 12/26/2013    Procedure: BRAVO PH STUDY;  Surgeon: Theda Belfast, MD;  Location: [facility];  Service: Endoscopy;  Laterality: [name];  . Laparoscopic nissen fundoplication [name] 03/18/2014    Procedure: LAPAROSCOPIC NISSEN FUNDOPLICATION & pylorplasty,omentapexy;  Surgeon: Karie Soda, MD;  Location: [facility];  Service: General;  Laterality: [name];   No family history on file. History  Substance Use Topics  . Smoking status: Never Smoker   . Smokeless tobacco: Never Used  . Alcohol Use: Yes     Comment: 2-3 glasses of wine per month   OB History   Grav Para Term Preterm Abortions TAB SAB Ect Mult Living                 Review of Systems  Constitutional: Negative for fever and chills.  HENT: Negative for congestion, rhinorrhea and sore throat.   Eyes: Negative for photophobia and visual disturbance.  Respiratory: Negative for cough and shortness of breath.   Cardiovascular: Negative for chest pain and leg swelling.  Gastrointestinal: Positive for nausea and diarrhea (since surgery). Negative for vomiting, abdominal pain and constipation.  Endocrine: Negative for polyphagia and polyuria.  Genitourinary: Negative  for dysuria, flank pain, vaginal bleeding, vaginal discharge and enuresis.  Musculoskeletal: Negative for back pain and gait problem.  Skin: Positive for rash. Negative for color change.  Neurological: Negative for dizziness, syncope, light-headedness and numbness.  Hematological: Negative for adenopathy. Does not bruise/bleed easily.  All other systems reviewed and are negative.     Allergies  Amoxicillin; Clindamycin/lincomycin; Other; Papaya  derivatives; and Septra  Home Medications   Prior to Admission medications   Medication Sig Start Date End Date Taking? Authorizing Provider  amitriptyline (ELAVIL) 25 MG tablet Take 25 mg by mouth at bedtime as needed (takes at bedtime if she is getting a migraine).   Yes Historical Provider, MD  cetirizine (ZYRTEC) 10 MG tablet Take 10 mg by mouth daily as needed (Itching).   Yes Historical Provider, MD  diclofenac (FLECTOR) 1.3 % PTCH Place 1 patch onto the skin 2 (two) times daily.    Yes Historical Provider, MD  EPINEPHrine (EPIPEN) 0.3 mg/0.3 mL IJ SOAJ injection Inject 0.3 mg into the muscle as needed (Allergic reaction).   Yes Historical Provider, MD  meloxicam (MOBIC) 7.5 MG tablet Take 7.5 mg by mouth 2 (two) times daily.    Yes Historical Provider, MD  methocarbamol (ROBAXIN) 500 MG tablet Take 1-2 tablets (500-1,000 mg total) by mouth every 6 (six) hours as needed for muscle spasms. 03/21/14  Yes Karie Soda, MD  metoCLOPramide (REGLAN) 10 MG tablet Take 1 tablet (10 mg total) by mouth every 6 (six) hours as needed for nausea (nausea / vomiting). 03/18/14  Yes Karie Soda, MD  omeprazole (PRILOSEC) 40 MG capsule Take 40 mg by mouth daily.    Yes Historical Provider, MD  ondansetron (ZOFRAN-ODT) 4 MG disintegrating tablet Take 4 mg by mouth every 8 (eight) hours as needed for nausea or vomiting.   Yes Historical Provider, MD  PARoxetine (PAXIL) 10 MG tablet Take 5 mg by mouth at bedtime.    Yes Historical Provider, MD  tapentadol (NUCYNTA) 50 MG TABS tablet Take 1-2 tablets (50-100 mg total) by mouth every 6 (six) hours as needed for moderate pain or severe pain. 03/21/14  Yes Karie Soda, MD  predniSONE (DELTASONE) 50 MG tablet Take 1 tablet (50 mg total) by mouth daily. 03/24/14   Mirian Mo, MD  promethazine (PHENERGAN) 25 MG suppository Place 1 suppository (25 mg total) rectally every 6 (six) hours as needed for nausea. 03/18/14   Karie Soda, MD   BP 102/69  Pulse 106  Temp(Src)  99.4 F (37.4 C) (Oral)  Resp 18  Wt 132 lb (59.875 kg)  SpO2 99%  LMP 02/17/2014 Physical Exam  Vitals reviewed. Constitutional: She is oriented to person, place, and time. She appears well-developed and well-nourished.  HENT:  Head: Normocephalic and atraumatic.  Right Ear: External ear normal.  Left Ear: External ear normal.  Eyes: Conjunctivae and EOM are normal. Pupils are equal, round, and reactive to light.  Neck: Normal range of motion. Neck supple.  Cardiovascular: Normal rate, regular rhythm, normal heart sounds and intact distal pulses.   Pulmonary/Chest: Effort normal and breath sounds normal.  Abdominal: Soft. Bowel sounds are normal. There is no tenderness.  Musculoskeletal: Normal range of motion.  Neurological: She is alert and oriented to person, place, and time.  Skin: Skin is warm and dry.  Erythematous papular/urticarial rash with clear linear demarcations over abd at tape sites, also on R AC.  Also with urticarial rash around neck    ED Course  Procedures (including critical care  time) Labs Review Labs Reviewed  COMPREHENSIVE METABOLIC PANEL - Abnormal; Notable for the following:    Sodium 136 (*)    Glucose, Bld 112 (*)    All other components within normal limits  CBC WITH DIFFERENTIAL  LIPASE, BLOOD  PREGNANCY, URINE  URINALYSIS, ROUTINE W REFLEX MICROSCOPIC    Imaging Review No results found.   EKG Interpretation None      MDM   Final diagnoses:  Urticaria  Irritant dermatitis  Diarrhea    38 y.o. female  with pertinent PMH of recent nissen presents with rash as described above beginning 3 days ago.  She has a ho prior rashes which were similar.  No systemic symptoms with exception of nausea and diarrhea.  The only new medications she has been taking regularly are robaxin, zofran, and benadryl.  No oropharyngeal involvement on exam, mucous membranes normal.    Labs and imaging as above reviewed. No acute abnormalities.  Pt had  improvement in tachycardia to <100 with 1l ns bolus.  She stated she felt better and wanted to go home.  She was given strict return precautions for both diarrhea and rash, voiced understanding, and agreed to fu. Solumedrol given in ED, and prednisone course for home.  She will fu with GI.   1. Urticaria   2. Irritant dermatitis   3. Diarrhea         Mirian Mo, MD 03/24/14 [phone]

## 2014-12-14 ENCOUNTER — Emergency Department (HOSPITAL_COMMUNITY)
Admission: EM | Admit: 2014-12-14 | Discharge: 2014-12-14 | Disposition: A | Discharge status: Home / Self Care | Payer: Medicaid Other | Attending: Emergency Medicine | Admitting: Emergency Medicine

## 2014-12-14 ENCOUNTER — Encounter (HOSPITAL_COMMUNITY): Payer: Self-pay | Admitting: Emergency Medicine

## 2014-12-14 DIAGNOSIS — Z3202 Encounter for pregnancy test, result negative: Secondary | ICD-10-CM | POA: Diagnosis not present

## 2014-12-14 DIAGNOSIS — M419 Scoliosis, unspecified: Secondary | ICD-10-CM | POA: Insufficient documentation

## 2014-12-14 DIAGNOSIS — G43909 Migraine, unspecified, not intractable, without status migrainosus: Secondary | ICD-10-CM | POA: Insufficient documentation

## 2014-12-14 DIAGNOSIS — M1990 Unspecified osteoarthritis, unspecified site: Secondary | ICD-10-CM | POA: Diagnosis not present

## 2014-12-14 DIAGNOSIS — F419 Anxiety disorder, unspecified: Secondary | ICD-10-CM | POA: Insufficient documentation

## 2014-12-14 DIAGNOSIS — K219 Gastro-esophageal reflux disease without esophagitis: Secondary | ICD-10-CM | POA: Diagnosis not present

## 2014-12-14 DIAGNOSIS — Z8639 Personal history of other endocrine, nutritional and metabolic disease: Secondary | ICD-10-CM | POA: Diagnosis not present

## 2014-12-14 DIAGNOSIS — Z880 Allergy status to penicillin: Secondary | ICD-10-CM | POA: Diagnosis not present

## 2014-12-14 DIAGNOSIS — Z872 Personal history of diseases of the skin and subcutaneous tissue: Secondary | ICD-10-CM | POA: Insufficient documentation

## 2014-12-14 DIAGNOSIS — Z79899 Other long term (current) drug therapy: Secondary | ICD-10-CM | POA: Insufficient documentation

## 2014-12-14 DIAGNOSIS — Z791 Long term (current) use of non-steroidal anti-inflammatories (NSAID): Secondary | ICD-10-CM | POA: Diagnosis not present

## 2014-12-14 DIAGNOSIS — Z7952 Long term (current) use of systemic steroids: Secondary | ICD-10-CM | POA: Diagnosis not present

## 2014-12-14 HISTORY — DX: Migraine, unspecified, not intractable, without status migrainosus: G43.909

## 2014-12-14 LAB — COMPREHENSIVE METABOLIC PANEL
ALBUMIN: 4 g/dL (ref 3.5–5.0)
ALK PHOS: 38 U/L (ref 38–126)
ALT: 10 U/L — ABNORMAL LOW (ref 14–54)
ANION GAP: 9 (ref 5–15)
AST: 15 U/L (ref 15–41)
BUN: 5 mg/dL — ABNORMAL LOW (ref 6–20)
CHLORIDE: 105 mmol/L (ref 101–111)
CO2: 23 mmol/L (ref 22–32)
Calcium: 9 mg/dL (ref 8.9–10.3)
Creatinine, Ser: 0.64 mg/dL (ref 0.44–1.00)
GFR calc Af Amer: >60 mL/min (ref >60)
GFR calc non Af Amer: >60 mL/min (ref >60)
GLUCOSE: 103 mg/dL — AB (ref 65–99)
POTASSIUM: 3.6 mmol/L (ref 3.5–5.1)
SODIUM: 137 mmol/L (ref 135–145)
TOTAL PROTEIN: 7.1 g/dL (ref 6.5–8.1)
Total Bilirubin: 0.9 mg/dL (ref 0.3–1.2)

## 2014-12-14 LAB — CBC WITH DIFFERENTIAL/PLATELET
BASOS ABS: 0 10*3/uL (ref 0.0–0.1)
BASOS PCT: 1 % (ref 0–1)
EOS PCT: 1 % (ref 0–5)
Eosinophils Absolute: 0 10*3/uL (ref 0.0–0.7)
HCT: 41.1 % (ref 36.0–46.0)
HEMOGLOBIN: 13.7 g/dL (ref 12.0–15.0)
LYMPHS ABS: 1.4 10*3/uL (ref 0.7–4.0)
LYMPHS PCT: 23 % (ref 12–46)
MCH: 29.9 pg (ref 26.0–34.0)
MCHC: 33.3 g/dL (ref 30.0–36.0)
MCV: 89.7 fL (ref 78.0–100.0)
Monocytes Absolute: 0.3 10*3/uL (ref 0.1–1.0)
Monocytes Relative: 6 % (ref 3–12)
NEUTROS PCT: 69 % (ref 43–77)
Neutro Abs: 4.2 10*3/uL (ref 1.7–7.7)
PLATELETS: 239 10*3/uL (ref 150–400)
RBC: 4.58 MIL/uL (ref 3.87–5.11)
RDW: 12.5 % (ref 11.5–15.5)
WBC: 5.9 10*3/uL (ref 4.0–10.5)

## 2014-12-14 LAB — URINALYSIS, ROUTINE W REFLEX MICROSCOPIC
BILIRUBIN URINE: NEGATIVE
GLUCOSE, UA: NEGATIVE mg/dL
Hgb urine dipstick: NEGATIVE
LEUKOCYTES UA: NEGATIVE
NITRITE: NEGATIVE
Protein, ur: NEGATIVE mg/dL
SPECIFIC GRAVITY, URINE: 1.016 (ref 1.005–1.030)
UROBILINOGEN UA: 0.2 mg/dL (ref 0.0–1.0)
pH: 5 (ref 5.0–8.0)

## 2014-12-14 LAB — POC URINE PREG, ED: PREG TEST UR: NEGATIVE

## 2014-12-14 MED ORDER — DIPHENHYDRAMINE HCL 50 MG/ML IJ SOLN
25.0000 mg | Freq: Once | INTRAMUSCULAR | Status: AC
  Administered 2014-12-14: 25 mg via INTRAVENOUS
  Filled 2014-12-14: qty 1

## 2014-12-14 MED ORDER — PROCHLORPERAZINE EDISYLATE 5 MG/ML IJ SOLN
10.0000 mg | Freq: Once | INTRAMUSCULAR | Status: AC
  Administered 2014-12-14: 10 mg via INTRAVENOUS
  Filled 2014-12-14: qty 2

## 2014-12-14 MED ORDER — METOCLOPRAMIDE HCL 5 MG/ML IJ SOLN: 10.0000 mg | Freq: Once | INTRAMUSCULAR | Status: DC

## 2014-12-14 MED ORDER — KETOROLAC TROMETHAMINE 30 MG/ML IJ SOLN
30.0000 mg | Freq: Once | INTRAMUSCULAR | Status: AC
  Administered 2014-12-14: 30 mg via INTRAVENOUS
  Filled 2014-12-14: qty 1

## 2014-12-14 NOTE — Discharge Instructions (Signed)

## 2014-12-14 NOTE — ED Provider Notes (Signed)
CSN: [id number]     Arrival date & time 12/14/14  1959 History   First MD Initiated Contact with Patient 12/14/14 2102     Chief Complaint  Patient presents with  . Migraine    Patient is a 39 y.o. female presenting with migraines. The history is provided by the patient.  Migraine This is a recurrent problem. The current episode started more than 2 days ago. The problem occurs constantly. The problem has not changed since onset.Associated symptoms include headaches. Pertinent negatives include no chest pain, no abdominal pain and no shortness of breath. Exacerbated by: certain smells, sounds, light and loud noises. Nothing relieves the symptoms. Treatments tried: ibuprofen. The treatment provided no relief.    Past Medical History  Diagnosis Date  . GERD (gastroesophageal reflux disease)   . Blood transfusion without reported diagnosis 23 yrs ago    self donation  . Arthritis   . Hip bursitis   . Scoliosis   . Elevated cholesterol   . Rosacea   . Jaundice, neonatal   . PONV (postoperative nausea and vomiting) 11-21-13  . Anxiety   . 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 Gastroparesis   . Family history of anesthesia complication     mother n/v  . Migraine headache    Past Surgical History  Procedure Laterality Date  . Esophageal manometry [name] 09/09/2013    Procedure: ESOPHAGEAL MANOMETRY (EM);  Surgeon: Theda Belfast, MD;  Location: [facility];  Service: Endoscopy;  Laterality: [name];  . Spinal fusion  1992    lower back harrington rods  . Tonsillectomy    . Hand tendon surgery Left 2008    Reattachment  . Wisdom tooth extraction    . Esophagogastroduodenoscopy [name] 11/21/2013    Procedure: ESOPHAGOGASTRODUODENOSCOPY (EGD);  Surgeon: Theda Belfast, MD;  Location: [facility];  Service: Endoscopy;  Laterality: [name];  . Bravo ph study [name] 11/21/2013    Procedure: BRAVO PH STUDY;  Surgeon: Theda Belfast, MD;  Location: [facility];  Service: Endoscopy;  Laterality: [name];  .  Esophagogastroduodenoscopy (egd) with propofol [name] 12/26/2013    Procedure: ESOPHAGOGASTRODUODENOSCOPY (EGD) WITH PROPOFOL;  Surgeon: Theda Belfast, MD;  Location: [facility];  Service: Endoscopy;  Laterality: [name];  . Bravo ph study [name] 12/26/2013    Procedure: BRAVO PH STUDY;  Surgeon: Theda Belfast, MD;  Location: [facility];  Service: Endoscopy;  Laterality: [name];  . Laparoscopic nissen fundoplication [name] 03/18/2014    Procedure: LAPAROSCOPIC NISSEN FUNDOPLICATION & pylorplasty,omentapexy;  Surgeon: Karie Soda, MD;  Location: [facility];  Service: General;  Laterality: [name];   No family history on file. History  Substance Use Topics  . Smoking status: Never Smoker   . Smokeless tobacco: Never Used  . Alcohol Use: Yes     Comment: 2-3 glasses of wine per month   OB History    No data available     Review of Systems  Respiratory: Negative for shortness of breath.   Cardiovascular: Negative for chest pain.  Gastrointestinal: Negative for abdominal pain.  Neurological: Positive for headaches.  All other systems reviewed and are negative.     Allergies  Amoxicillin; Clindamycin/lincomycin; Other; Papaya derivatives; Septra; and Adhesive  Home Medications   Prior to Admission medications   Medication Sig Start Date End Date Taking? Authorizing Provider  ibuprofen (ADVIL,MOTRIN) 200 MG tablet Take 400 mg by mouth every 6 (six) hours as needed for moderate pain.   Yes Historical Provider, MD  ondansetron (  ZOFRAN-ODT) 4 MG disintegrating tablet Take 4 mg by mouth every 8 (eight) hours as needed for nausea or vomiting.   Yes Historical Provider, MD  cetirizine (ZYRTEC) 10 MG tablet Take 10 mg by mouth daily as needed (Itching).    Historical Provider, MD  diclofenac (FLECTOR) 1.3 % PTCH Place 1 patch onto the skin 2 (two) times daily.     Historical Provider, MD  EPINEPHrine (EPIPEN) 0.3 mg/0.3 mL IJ SOAJ injection Inject 0.3 mg into the muscle as needed (Allergic reaction).     Historical Provider, MD  meloxicam (MOBIC) 7.5 MG tablet Take 7.5 mg by mouth 2 (two) times daily.     Historical Provider, MD  methocarbamol (ROBAXIN) 500 MG tablet Take 1-2 tablets (500-1,000 mg total) by mouth every 6 (six) hours as needed for muscle spasms. 03/21/14   Karie Soda, MD  metoCLOPramide (REGLAN) 10 MG tablet Take 1 tablet (10 mg total) by mouth every 6 (six) hours as needed for nausea (nausea / vomiting). 03/18/14   Karie Soda, MD  omeprazole (PRILOSEC) 40 MG capsule Take 40 mg by mouth daily.     Historical Provider, MD  ondansetron (ZOFRAN-ODT) 4 MG disintegrating tablet Take 4 mg by mouth every 8 (eight) hours as needed for nausea or vomiting.    Historical Provider, MD  PARoxetine (PAXIL) 10 MG tablet Take 5 mg by mouth at bedtime.     Historical Provider, MD  predniSONE (DELTASONE) 50 MG tablet Take 1 tablet (50 mg total) by mouth daily. 03/24/14   Mirian Mo, MD  promethazine (PHENERGAN) 25 MG suppository Place 1 suppository (25 mg total) rectally every 6 (six) hours as needed for nausea. 03/18/14   Karie Soda, MD  tapentadol (NUCYNTA) 50 MG TABS tablet Take 1-2 tablets (50-100 mg total) by mouth every 6 (six) hours as needed for moderate pain or severe pain. 03/21/14   Karie Soda, MD   BP 104/69 mmHg  Pulse 84  Temp(Src) 98.2 F (36.8 C) (Oral)  Resp 12  Ht 5' 3.5" (1.613 m)  Wt 132 lb (59.875 kg)  BMI 23.01 kg/m2  SpO2 98%  LMP 11/23/2014 Physical Exam  Constitutional: She appears well-developed and well-nourished. No distress.  HENT:  Head: Normocephalic and atraumatic.  Right Ear: External ear normal.  Left Ear: External ear normal.  Eyes: Conjunctivae are normal. Right eye exhibits no discharge. Left eye exhibits no discharge. No scleral icterus.  Neck: Neck supple. No tracheal deviation present.  Cardiovascular: Normal rate, regular rhythm and intact distal pulses.   Pulmonary/Chest: Effort normal and breath sounds normal. No stridor. No respiratory  distress. She has no wheezes. She has no rales.  Abdominal: Soft. Bowel sounds are normal. She exhibits no distension. There is no tenderness. There is no rebound and no guarding.  Musculoskeletal: She exhibits no edema or tenderness.  Neurological: She is alert. She has normal strength. No cranial nerve deficit (no facial droop, extraocular movements intact, no slurred speech) or sensory deficit. She exhibits normal muscle tone. She displays no seizure activity. Coordination normal.  Skin: Skin is warm and dry. No rash noted.  Psychiatric: She has a normal mood and affect.  Nursing note and vitals reviewed.   ED Course  Procedures (including critical care time) Labs Review Labs Reviewed  COMPREHENSIVE METABOLIC PANEL - Abnormal; Notable for the following:    Glucose, Bld 103 (*)    BUN 5 (*)    ALT 10 (*)    All other components within normal limits  URINALYSIS, ROUTINE W REFLEX MICROSCOPIC (NOT [facility]) - Abnormal; Notable for the following:    APPearance HAZY (*)    Ketones, ur >80 (*)    All other components within normal limits  CBC WITH DIFFERENTIAL/PLATELET  POC URINE PREG, ED   At this time there does not appear to be any evidence of an acute emergency medical condition and the patient appears stable for discharge with appropriate outpatient follow up.  MDM   Final diagnoses:  Migraine without status migrainosus, not intractable, unspecified migraine type   Patient improved with treatment of her migraine headache.   Ready to go home. At this time there does not appear to be any evidence of an acute emergency medical condition and the patient appears stable for discharge with appropriate outpatient follow up.     Linwood DibblesJon Jase Himmelberger, MD 12/14/14 [id number]

## 2014-12-14 NOTE — ED Notes (Signed)
Pt. reports migraine headache onset 3 days ago with mild nausea and photophobia , denies fever or chills. No emesis or blurred vision .

## 2015-09-09 ENCOUNTER — Encounter (HOSPITAL_COMMUNITY): Payer: Self-pay | Admitting: Emergency Medicine

## 2015-09-09 DIAGNOSIS — Z7952 Long term (current) use of systemic steroids: Secondary | ICD-10-CM | POA: Diagnosis not present

## 2015-09-09 DIAGNOSIS — Z791 Long term (current) use of non-steroidal anti-inflammatories (NSAID): Secondary | ICD-10-CM | POA: Insufficient documentation

## 2015-09-09 DIAGNOSIS — Z79899 Other long term (current) drug therapy: Secondary | ICD-10-CM | POA: Insufficient documentation

## 2015-09-09 DIAGNOSIS — R51 Headache: Secondary | ICD-10-CM | POA: Diagnosis present

## 2015-09-09 DIAGNOSIS — Z880 Allergy status to penicillin: Secondary | ICD-10-CM | POA: Insufficient documentation

## 2015-09-09 DIAGNOSIS — Z872 Personal history of diseases of the skin and subcutaneous tissue: Secondary | ICD-10-CM | POA: Insufficient documentation

## 2015-09-09 DIAGNOSIS — G43809 Other migraine, not intractable, without status migrainosus: Secondary | ICD-10-CM | POA: Diagnosis not present

## 2015-09-09 DIAGNOSIS — K219 Gastro-esophageal reflux disease without esophagitis: Secondary | ICD-10-CM | POA: Diagnosis not present

## 2015-09-09 DIAGNOSIS — F419 Anxiety disorder, unspecified: Secondary | ICD-10-CM | POA: Insufficient documentation

## 2015-09-09 DIAGNOSIS — M1990 Unspecified osteoarthritis, unspecified site: Secondary | ICD-10-CM | POA: Insufficient documentation

## 2015-09-09 LAB — CBC WITH DIFFERENTIAL/PLATELET
BASOS PCT: 1 %
Basophils Absolute: 0 10*3/uL (ref 0.0–0.1)
EOS PCT: 3 %
Eosinophils Absolute: 0.2 10*3/uL (ref 0.0–0.7)
HCT: 40.9 % (ref 36.0–46.0)
Hemoglobin: 13.6 g/dL (ref 12.0–15.0)
Lymphocytes Relative: 28 %
Lymphs Abs: 1.9 10*3/uL (ref 0.7–4.0)
MCH: 30.1 pg (ref 26.0–34.0)
MCHC: 33.3 g/dL (ref 30.0–36.0)
MCV: 90.5 fL (ref 78.0–100.0)
MONO ABS: 0.7 10*3/uL (ref 0.1–1.0)
Monocytes Relative: 10 %
Neutro Abs: 3.9 10*3/uL (ref 1.7–7.7)
Neutrophils Relative %: 58 %
PLATELETS: 236 10*3/uL (ref 150–400)
RBC: 4.52 MIL/uL (ref 3.87–5.11)
RDW: 12.2 % (ref 11.5–15.5)
WBC: 6.7 10*3/uL (ref 4.0–10.5)

## 2015-09-09 LAB — BASIC METABOLIC PANEL
Anion gap: 10 (ref 5–15)
BUN: 6 mg/dL (ref 6–20)
CALCIUM: 9.1 mg/dL (ref 8.9–10.3)
CHLORIDE: 104 mmol/L (ref 101–111)
CO2: 26 mmol/L (ref 22–32)
CREATININE: 0.54 mg/dL (ref 0.44–1.00)
GFR calc Af Amer: >60 mL/min (ref >60)
GFR calc non Af Amer: >60 mL/min (ref >60)
Glucose, Bld: 105 mg/dL — ABNORMAL HIGH (ref 65–99)
Potassium: 3.8 mmol/L (ref 3.5–5.1)
Sodium: 140 mmol/L (ref 135–145)

## 2015-09-09 NOTE — ED Notes (Signed)
Pt. reports migraine headache with nausea and photophobia/audiophobia onset 3 days ago .

## 2015-09-10 ENCOUNTER — Emergency Department (HOSPITAL_COMMUNITY)
Admission: EM | Admit: 2015-09-10 | Discharge: 2015-09-10 | Disposition: A | Discharge status: Home / Self Care | Payer: Medicaid Other | Attending: Emergency Medicine | Admitting: Emergency Medicine

## 2015-09-10 DIAGNOSIS — G43809 Other migraine, not intractable, without status migrainosus: Secondary | ICD-10-CM

## 2015-09-10 MED ORDER — KETOROLAC TROMETHAMINE 30 MG/ML IJ SOLN
30.0000 mg | Freq: Once | INTRAMUSCULAR | Status: AC
  Administered 2015-09-10: 30 mg via INTRAVENOUS
  Filled 2015-09-10: qty 1

## 2015-09-10 MED ORDER — PROCHLORPERAZINE EDISYLATE 5 MG/ML IJ SOLN
10.0000 mg | Freq: Four times a day (QID) | INTRAMUSCULAR | Status: DC | PRN
  Administered 2015-09-10: 10 mg via INTRAVENOUS
  Filled 2015-09-10: qty 2

## 2015-09-10 MED ORDER — METOCLOPRAMIDE HCL 5 MG/ML IJ SOLN: 10.0000 mg | Freq: Once | INTRAMUSCULAR | Status: DC

## 2015-09-10 MED ORDER — SODIUM CHLORIDE 0.9 % IV BOLUS (SEPSIS)
1000.0000 mL | Freq: Once | INTRAVENOUS | Status: AC
  Administered 2015-09-10: 1000 mL via INTRAVENOUS

## 2015-09-10 MED ORDER — MORPHINE SULFATE (PF) 4 MG/ML IV SOLN
6.0000 mg | Freq: Once | INTRAVENOUS | Status: AC
  Administered 2015-09-10: 6 mg via INTRAVENOUS
  Filled 2015-09-10: qty 2

## 2015-09-10 NOTE — ED Provider Notes (Signed)
CSN: [id number]     Arrival date & time 09/09/15  1901 History  By signing my name below, I, Freida Busman, attest that this documentation has been prepared under the direction and in the presence of Azalia Bilis, MD . Electronically Signed: Freida Busman, Scribe. 09/10/2015. 1:18 AM.    Chief Complaint  Patient presents with  . Migraine    The history is provided by the patient. No language interpreter was used.     HPI Comments:  Theresa Shelton is a 40 y.o. female with a history of migraines, who presents to the Emergency Department complaining of HA x 4 day. She rates her pain an 8/10 pain. She notes her pain today is similar to past migraine HAs and notes she has had a migraine last as long as 2 weeks in the past. Pt reports associated nausea and photophobia. She denies vomiting, numbness and weakness in her extremities. No alleviating factors noted.   Past Medical History  Diagnosis Date  . GERD (gastroesophageal reflux disease)   . Blood transfusion without reported diagnosis 23 yrs ago    self donation  . Arthritis   . Hip bursitis   . Scoliosis   . Elevated cholesterol   . Rosacea   . Jaundice, neonatal   . PONV (postoperative nausea and vomiting) 11-21-13  . Anxiety   . 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 Gastroparesis   . Family history of anesthesia complication     mother n/v  . Migraine headache    Past Surgical History  Procedure Laterality Date  . Esophageal manometry [name] 09/09/2013    Procedure: ESOPHAGEAL MANOMETRY (EM);  Surgeon: Theda Belfast, MD;  Location: [facility];  Service: Endoscopy;  Laterality: [name];  . Spinal fusion  1992    lower back harrington rods  . Tonsillectomy    . Hand tendon surgery Left 2008    Reattachment  . Wisdom tooth extraction    . Esophagogastroduodenoscopy [name] 11/21/2013    Procedure: ESOPHAGOGASTRODUODENOSCOPY (EGD);  Surgeon: Theda Belfast, MD;  Location: [facility];  Service: Endoscopy;  Laterality: [name];  . Bravo  ph study [name] 11/21/2013    Procedure: BRAVO PH STUDY;  Surgeon: Theda Belfast, MD;  Location: [facility];  Service: Endoscopy;  Laterality: [name];  . Esophagogastroduodenoscopy (egd) with propofol [name] 12/26/2013    Procedure: ESOPHAGOGASTRODUODENOSCOPY (EGD) WITH PROPOFOL;  Surgeon: Theda Belfast, MD;  Location: [facility];  Service: Endoscopy;  Laterality: [name];  . Bravo ph study [name] 12/26/2013    Procedure: BRAVO PH STUDY;  Surgeon: Theda Belfast, MD;  Location: [facility];  Service: Endoscopy;  Laterality: [name];  . Laparoscopic nissen fundoplication [name] 03/18/2014    Procedure: LAPAROSCOPIC NISSEN FUNDOPLICATION & pylorplasty,omentapexy;  Surgeon: Karie Soda, MD;  Location: [facility];  Service: General;  Laterality: [name];   No family history on file. Social History  Substance Use Topics  . Smoking status: Never Smoker   . Smokeless tobacco: Never Used  . Alcohol Use: Yes     Comment: 2-3 glasses of wine per month   OB History    No data available     Review of Systems  10 systems reviewed and all are negative for acute change except as noted in the HPI.  Allergies  Amoxicillin; Clindamycin/lincomycin; Other; Papaya derivatives; Septra; and Adhesive  Home Medications   Prior to Admission medications   Medication Sig Start Date End Date Taking? Authorizing Provider  cetirizine (ZYRTEC) 10 MG tablet Take  10 mg by mouth daily as needed (Itching).    Historical Provider, MD  diclofenac (FLECTOR) 1.3 % PTCH Place 1 patch onto the skin 2 (two) times daily.     Historical Provider, MD  EPINEPHrine (EPIPEN) 0.3 mg/0.3 mL IJ SOAJ injection Inject 0.3 mg into the muscle as needed (Allergic reaction).    Historical Provider, MD  ibuprofen (ADVIL,MOTRIN) 200 MG tablet Take 400 mg by mouth every 6 (six) hours as needed for moderate pain.    Historical Provider, MD  meloxicam (MOBIC) 7.5 MG tablet Take 7.5 mg by mouth 2 (two) times daily.     Historical Provider, MD  methocarbamol (ROBAXIN) 500  MG tablet Take 1-2 tablets (500-1,000 mg total) by mouth every 6 (six) hours as needed for muscle spasms. 03/21/14   Karie Soda, MD  metoCLOPramide (REGLAN) 10 MG tablet Take 1 tablet (10 mg total) by mouth every 6 (six) hours as needed for nausea (nausea / vomiting). 03/18/14   Karie Soda, MD  omeprazole (PRILOSEC) 40 MG capsule Take 40 mg by mouth daily.     Historical Provider, MD  ondansetron (ZOFRAN-ODT) 4 MG disintegrating tablet Take 4 mg by mouth every 8 (eight) hours as needed for nausea or vomiting.    Historical Provider, MD  ondansetron (ZOFRAN-ODT) 4 MG disintegrating tablet Take 4 mg by mouth every 8 (eight) hours as needed for nausea or vomiting.    Historical Provider, MD  PARoxetine (PAXIL) 10 MG tablet Take 5 mg by mouth at bedtime.     Historical Provider, MD  predniSONE (DELTASONE) 50 MG tablet Take 1 tablet (50 mg total) by mouth daily. 03/24/14   Mirian Mo, MD  promethazine (PHENERGAN) 25 MG suppository Place 1 suppository (25 mg total) rectally every 6 (six) hours as needed for nausea. 03/18/14   Karie Soda, MD  tapentadol (NUCYNTA) 50 MG TABS tablet Take 1-2 tablets (50-100 mg total) by mouth every 6 (six) hours as needed for moderate pain or severe pain. 03/21/14   Karie Soda, MD   BP 110/75 mmHg  Pulse 80  Temp(Src) 98.3 F (36.8 C) (Oral)  Resp 18  SpO2 99%  LMP 09/07/2015 (Exact Date) Physical Exam  Constitutional: She is oriented to person, place, and time. She appears well-developed and well-nourished. No distress.  HENT:  Head: Normocephalic and atraumatic.  Eyes: EOM are normal. Pupils are equal, round, and reactive to light.  Neck: Normal range of motion.  Cardiovascular: Normal rate, regular rhythm and normal heart sounds.   Pulmonary/Chest: Effort normal and breath sounds normal.  Abdominal: Soft. She exhibits no distension. There is no tenderness.  Musculoskeletal: Normal range of motion.  Neurological: She is alert and oriented to person, place, and  time.  5/5 strength in major muscle groups of  bilateral upper and lower extremities. Speech normal. No facial asymetry.   Skin: Skin is warm and dry.  Psychiatric: She has a normal mood and affect. Judgment normal.  Nursing note and vitals reviewed.   ED Course  Procedures   DIAGNOSTIC STUDIES:  Oxygen Saturation is 99% on RA, normal by my interpretation.    COORDINATION OF CARE:  1:17 AM Will order migraine cocktail. Discussed treatment plan with pt at bedside and pt agreed to plan.  Labs Review Labs Reviewed  BASIC METABOLIC PANEL - Abnormal; Notable for the following:    Glucose, Bld 105 (*)    All other components within normal limits  CBC WITH DIFFERENTIAL/PLATELET   I have personally reviewed and evaluated  these lab results as part of my medical decision-making.   MDM   Final diagnoses:  None    Patient feels much better after management of her headache in the emergency department.  Normal neurologic exam.  Discharge home in good condition.  Primary care follow-up.  Patient understands return the ER for new or worsening symptoms  I personally performed the services described in this documentation, which was scribed in my presence. The recorded information has been reviewed and is accurate.       Azalia Bilis, MD 09/11/15 [phone]

## 2015-09-10 NOTE — Discharge Instructions (Signed)

## 2015-11-13 ENCOUNTER — Emergency Department (HOSPITAL_COMMUNITY)
Admission: EM | Admit: 2015-11-13 | Discharge: 2015-11-13 | Disposition: A | Discharge status: Home / Self Care | Payer: Medicaid Other | Attending: Emergency Medicine | Admitting: Emergency Medicine

## 2015-11-13 ENCOUNTER — Encounter (HOSPITAL_COMMUNITY): Payer: Self-pay

## 2015-11-13 ENCOUNTER — Emergency Department (HOSPITAL_COMMUNITY): Payer: Medicaid Other

## 2015-11-13 DIAGNOSIS — Z8739 Personal history of other diseases of the musculoskeletal system and connective tissue: Secondary | ICD-10-CM | POA: Insufficient documentation

## 2015-11-13 DIAGNOSIS — M545 Low back pain, unspecified: Secondary | ICD-10-CM

## 2015-11-13 DIAGNOSIS — W108XXA Fall (on) (from) other stairs and steps, initial encounter: Secondary | ICD-10-CM | POA: Diagnosis not present

## 2015-11-13 DIAGNOSIS — Z3202 Encounter for pregnancy test, result negative: Secondary | ICD-10-CM | POA: Diagnosis not present

## 2015-11-13 DIAGNOSIS — Z8659 Personal history of other mental and behavioral disorders: Secondary | ICD-10-CM | POA: Insufficient documentation

## 2015-11-13 DIAGNOSIS — Z8639 Personal history of other endocrine, nutritional and metabolic disease: Secondary | ICD-10-CM | POA: Diagnosis not present

## 2015-11-13 DIAGNOSIS — Z8719 Personal history of other diseases of the digestive system: Secondary | ICD-10-CM | POA: Diagnosis not present

## 2015-11-13 DIAGNOSIS — S300XXA Contusion of lower back and pelvis, initial encounter: Secondary | ICD-10-CM | POA: Diagnosis not present

## 2015-11-13 DIAGNOSIS — Z880 Allergy status to penicillin: Secondary | ICD-10-CM | POA: Insufficient documentation

## 2015-11-13 DIAGNOSIS — Z872 Personal history of diseases of the skin and subcutaneous tissue: Secondary | ICD-10-CM | POA: Diagnosis not present

## 2015-11-13 DIAGNOSIS — Y9289 Other specified places as the place of occurrence of the external cause: Secondary | ICD-10-CM | POA: Diagnosis not present

## 2015-11-13 DIAGNOSIS — S60811A Abrasion of right wrist, initial encounter: Secondary | ICD-10-CM | POA: Insufficient documentation

## 2015-11-13 DIAGNOSIS — Y998 Other external cause status: Secondary | ICD-10-CM | POA: Insufficient documentation

## 2015-11-13 DIAGNOSIS — S79912A Unspecified injury of left hip, initial encounter: Secondary | ICD-10-CM | POA: Insufficient documentation

## 2015-11-13 DIAGNOSIS — T148XXA Other injury of unspecified body region, initial encounter: Secondary | ICD-10-CM

## 2015-11-13 DIAGNOSIS — Z8679 Personal history of other diseases of the circulatory system: Secondary | ICD-10-CM | POA: Diagnosis not present

## 2015-11-13 DIAGNOSIS — S40811A Abrasion of right upper arm, initial encounter: Secondary | ICD-10-CM

## 2015-11-13 DIAGNOSIS — S60511A Abrasion of right hand, initial encounter: Secondary | ICD-10-CM | POA: Diagnosis not present

## 2015-11-13 DIAGNOSIS — S3992XA Unspecified injury of lower back, initial encounter: Secondary | ICD-10-CM | POA: Diagnosis present

## 2015-11-13 DIAGNOSIS — Y9389 Activity, other specified: Secondary | ICD-10-CM | POA: Diagnosis not present

## 2015-11-13 DIAGNOSIS — M25552 Pain in left hip: Secondary | ICD-10-CM

## 2015-11-13 LAB — POC URINE PREG, ED: Preg Test, Ur: NEGATIVE

## 2015-11-13 MED ORDER — MORPHINE SULFATE (PF) 4 MG/ML IV SOLN
8.0000 mg | Freq: Once | INTRAVENOUS | Status: AC
  Administered 2015-11-13: 8 mg via INTRAMUSCULAR
  Filled 2015-11-13: qty 2

## 2015-11-13 MED ORDER — ONDANSETRON HCL 4 MG/2ML IJ SOLN
4.0000 mg | Freq: Once | INTRAMUSCULAR | Status: AC
  Administered 2015-11-13: 4 mg via INTRAVENOUS
  Filled 2015-11-13: qty 2

## 2015-11-13 MED ORDER — OXYCODONE HCL 5 MG PO TABS: 5.0000 mg | ORAL_TABLET | Freq: Four times a day (QID) | ORAL | Status: AC | PRN

## 2015-11-13 MED ORDER — HYDROMORPHONE HCL 1 MG/ML IJ SOLN
1.0000 mg | Freq: Once | INTRAMUSCULAR | Status: AC
  Administered 2015-11-13: 1 mg via INTRAVENOUS
  Filled 2015-11-13: qty 1

## 2015-11-13 MED ORDER — PROCHLORPERAZINE EDISYLATE 5 MG/ML IJ SOLN
10.0000 mg | Freq: Once | INTRAMUSCULAR | Status: AC
  Administered 2015-11-13: 10 mg via INTRAVENOUS
  Filled 2015-11-13: qty 2

## 2015-11-13 NOTE — ED Notes (Signed)
Patient transported to CT 

## 2015-11-13 NOTE — Discharge Instructions (Signed)
Use ibuprofen as needed for inflammation and pain with roxicodone as directed as needed for breakthrough pain. Do not drive or operate machinery with pain medication use. Use ice to areas of soreness, no more than 20 minutes at a time every hour. Expect to be sore for the next few days and follow up with a primary care physician or your orthopedist for recheck of ongoing symptoms in the next 1-2 weeks. Use over the counter arnica as needed for bruising and muscle pain. The hematoma may take weeks to months to resolve. Return to ER for emergent changing or worsening of symptoms, including but not limited to: redness, worsening swelling, warmth, fevers, leg or groin numbness, or incontinence of stool/urine.   Contusion A contusion is a deep bruise. Contusions happen when an injury causes bleeding under the skin. Symptoms of bruising include pain, swelling, and discolored skin. The skin may turn blue, purple, or yellow. HOME CARE   Rest the injured area.  If told, put ice on the injured area.  Put ice in a plastic bag.  Place a towel between your skin and the bag.  Leave the ice on for 20 minutes, 2-3 times per day.  If told, put light pressure (compression) on the injured area using an elastic bandage. Make sure the bandage is not too tight. Remove it and put it back on as told by your doctor.  If possible, raise (elevate) the injured area above the level of your heart while you are sitting or lying down.  Take over-the-counter and prescription medicines only as told by your doctor. GET HELP IF:  Your symptoms do not get better after several days of treatment.  Your symptoms get worse.  You have trouble moving the injured area. GET HELP RIGHT AWAY IF:   You have very bad pain.  You have a loss of feeling (numbness) in a hand or foot.  Your hand or foot turns pale or cold.   This information is not intended to replace advice given to you by your health care provider. Make sure you  discuss any questions you have with your health care provider.   Document Released: 12/21/2007 Document Revised: 03/25/2015 Document Reviewed: 11/19/2014 Elsevier Interactive Patient Education 2016 [employer]  Cryotherapy Cryotherapy means treatment with cold. Ice or gel packs can be used to reduce both pain and swelling. Ice is the most helpful within the first 24 to 48 hours after an injury or flare-up from overusing a muscle or joint. Sprains, strains, spasms, burning pain, shooting pain, and aches can all be eased with ice. Ice can also be used when recovering from surgery. Ice is effective, has very few side effects, and is safe for most people to use. PRECAUTIONS  Ice is not a safe treatment option for people with:  Raynaud phenomenon. This is a condition affecting small blood vessels in the extremities. Exposure to cold may cause your problems to return.  Cold hypersensitivity. There are many forms of cold hypersensitivity, including:  Cold urticaria. Red, itchy hives appear on the skin when the tissues begin to warm after being iced.  Cold erythema. This is a red, itchy rash caused by exposure to cold.  Cold hemoglobinuria. Red blood cells break down when the tissues begin to warm after being iced. The hemoglobin that carry oxygen are passed into the urine because they cannot combine with blood proteins fast enough.  Numbness or altered sensitivity in the area being iced. If you have any of the following conditions,  do not use ice until you have discussed cryotherapy with your caregiver:  Heart conditions, such as arrhythmia, angina, or chronic heart disease.  High blood pressure.  Healing wounds or open skin in the area being iced.  Current infections.  Rheumatoid arthritis.  Poor circulation.  Diabetes. Ice slows the blood flow in the region it is applied. This is beneficial when trying to stop inflamed tissues from spreading irritating chemicals to surrounding  tissues. However, if you expose your skin to cold temperatures for too long or without the proper protection, you can damage your skin or nerves. Watch for signs of skin damage due to cold. HOME CARE INSTRUCTIONS Follow these tips to use ice and cold packs safely.  Place a dry or damp towel between the ice and skin. A damp towel will cool the skin more quickly, so you may need to shorten the time that the ice is used.  For a more rapid response, add gentle compression to the ice.  Ice for no more than 10 to 20 minutes at a time. The bonier the area you are icing, the less time it will take to get the benefits of ice.  Check your skin after 5 minutes to make sure there are no signs of a poor response to cold or skin damage.  Rest 20 minutes or more between uses.  Once your skin is numb, you can end your treatment. You can test numbness by very lightly touching your skin. The touch should be so light that you do not see the skin dimple from the pressure of your fingertip. When using ice, most people will feel these normal sensations in this order: cold, burning, aching, and numbness.  Do not use ice on someone who cannot communicate their responses to pain, such as small children or people with dementia. HOW TO MAKE AN ICE PACK Ice packs are the most common way to use ice therapy. Other methods include ice massage, ice baths, and cryosprays. Muscle creams that cause a cold, tingly feeling do not offer the same benefits that ice offers and should not be used as a substitute unless recommended by your caregiver. To make an ice pack, do one of the following:  Place crushed ice or a bag of frozen vegetables in a sealable plastic bag. Squeeze out the excess air. Place this bag inside another plastic bag. Slide the bag into a pillowcase or place a damp towel between your skin and the bag.  Mix 3 parts water with 1 part rubbing alcohol. Freeze the mixture in a sealable plastic bag. When you remove the  mixture from the freezer, it will be slushy. Squeeze out the excess air. Place this bag inside another plastic bag. Slide the bag into a pillowcase or place a damp towel between your skin and the bag. SEEK MEDICAL CARE IF:  You develop white spots on your skin. This may give the skin a blotchy (mottled) appearance.  Your skin turns blue or pale.  Your skin becomes waxy or hard.  Your swelling gets worse. MAKE SURE YOU:   Understand these instructions.  Will watch your condition.  Will get help right away if you are not doing well or get worse.   This information is not intended to replace advice given to you by your health care provider. Make sure you discuss any questions you have with your health care provider.   Document Released: 02/28/2011 Document Revised: 07/25/2014 Document Reviewed: 02/28/2011 Elsevier Interactive Patient Education [employer]  Musculoskeletal Pain Musculoskeletal pain is muscle and boney aches and pains. These pains can occur in any part of the body. Your caregiver may treat you without knowing the cause of the pain. They may treat you if blood or urine tests, X-rays, and other tests were normal.  CAUSES There is often not a definite cause or reason for these pains. These pains may be caused by a type of germ (virus). The discomfort may also come from overuse. Overuse includes working out too hard when your body is not fit. Boney aches also come from weather changes. Bone is sensitive to atmospheric pressure changes. HOME CARE INSTRUCTIONS   Ask when your test results will be ready. Make sure you get your test results.  Only take over-the-counter or prescription medicines for pain, discomfort, or fever as directed by your caregiver. If you were given medications for your condition, do not drive, operate machinery or power tools, or sign legal documents for 24 hours. Do not drink alcohol. Do not take sleeping pills or other medications that may  interfere with treatment.  Continue all activities unless the activities cause more pain. When the pain lessens, slowly resume normal activities. Gradually increase the intensity and duration of the activities or exercise.  During periods of severe pain, bed rest may be helpful. Lay or sit in any position that is comfortable.  Putting ice on the injured area.  Put ice in a bag.  Place a towel between your skin and the bag.  Leave the ice on for 15 to 20 minutes, 3 to 4 times a day.  Follow up with your caregiver for continued problems and no reason can be found for the pain. If the pain becomes worse or does not go away, it may be necessary to repeat tests or do additional testing. Your caregiver may need to look further for a possible cause. SEEK IMMEDIATE MEDICAL CARE IF:  You have pain that is getting worse and is not relieved by medications.  You develop chest pain that is associated with shortness or breath, sweating, feeling sick to your stomach (nauseous), or throw up (vomit).  Your pain becomes localized to the abdomen.  You develop any new symptoms that seem different or that concern you. MAKE SURE YOU:   Understand these instructions.  Will watch your condition.  Will get help right away if you are not doing well or get worse.   This information is not intended to replace advice given to you by your health care provider. Make sure you discuss any questions you have with your health care provider.   Document Released: 07/04/2005 Document Revised: 09/26/2011 Document Reviewed: 03/08/2013 Elsevier Interactive Patient Education 2016 [employer]  Tailbone Injury The tailbone is the small bone at the lower end of the backbone (spine). You may have stretched tissues, bruises, or a broken bone (fracture). These injuries can be painful. Most tailbone injuries get better on their own in 4-6 weeks. HOME CARE  Take medicines only as told by your doctor.  If told, apply ice  to the injured area.  Put ice in a plastic bag.  Place a towel between your skin and the bag.  Leave the ice on for 20 minutes, 2-3 times per day. Do this for the first 1-2 days.  Sit on a large, rubber or inflated ring or cushion to lessen pain. Lean forward when you sit to help lessen pain.  Avoid sitting in one place for a long time.  Increase your activity  as the pain allows.  Do exercises as told by your doctor or physical therapist.  If it is painful to poop, take medicine to help you poop (stool softeners) as told by your doctor.  Eat foods that have plenty of fiber.  Keep all follow-up visits as told by your doctor. This is important. GET HELP IF:  Your pain gets worse.  Pooping causes you pain.  You cannot poop (constipation).  You are leaking pee (urinary incontinence).  You have a fever.   This information is not intended to replace advice given to you by your health care provider. Make sure you discuss any questions you have with your health care provider.   Document Released: 08/06/2010 Document Revised: 11/18/2014 Document Reviewed: 06/30/2014 Elsevier Interactive Patient Education [employer]

## 2015-11-13 NOTE — ED Provider Notes (Signed)
CSN: [id number]     Arrival date & time 11/13/15  1556 History  By signing my name below, I, Tanda Rockers, attest that this documentation has been prepared under the direction and in the presence of [address] [employer]. Electronically Signed: Tanda Rockers, ED Scribe. 11/13/2015. 5:22 PM.    Chief Complaint  Patient presents with  . Fall   Patient is a 40 y.o. female presenting with fall. The history is provided by the patient and medical records. No language interpreter was used.  Fall This is a new problem. The current episode started 6 to 12 hours ago. The problem occurs rarely. The problem has not changed since onset.Pertinent negatives include no chest pain, no abdominal pain, no headaches and no shortness of breath. The symptoms are aggravated by walking. Nothing relieves the symptoms. She has tried a cold compress (advil) for the symptoms. The treatment provided no relief.     HPI Comments: Mallie Giambra is a 40 y.o. female with PMHx of chronic migraines, Arhtritis and Hip bursitis, with a PSHx of lumbar spine fusion, who presents to the Emergency Department complaining of sudden onset, constant, waxing and waning, 8/10, throbbing/aching, left hip pain radiating to left lower back and down left leg s/p fall that occurred at 8 AM this morning (approximately 9 hours ago). Pt reports that she slipped on a slippery wooden step and fell backwards onto her buttock causing her to slide down 3 outdoor steps. No head injury or LOC. Pt states she intermittently has a sharp/stabbing pain to the area. She also heard a popping noise in her hip on her way to the ED. She complains of massive bruise and moderate swelling to the L buttock as well as small abrasions to right hand and wrist but states her R hand/wrist aren't bothering her. The hip/back pain is exacerbated with walking, although she has been able to ambulate today. She tried ice and advil with no relief. Denies weakness, numbness, tingling,  chest pain, shortness of breath, abdominal pain, nausea, vomiting, urinary or bowel incontinence, difficulty urinating, hematuria, saddle anesthesia or cauda equina symptoms, or any other associated symptoms. Pt is not on any anticoagulants. Takes multiple vitamin supplements and OCPs but no other medications at this time.   Past Medical History  Diagnosis Date  . GERD (gastroesophageal reflux disease)   . Blood transfusion without reported diagnosis 23 yrs ago    self donation  . Arthritis   . Hip bursitis   . Scoliosis   . Elevated cholesterol   . Rosacea   . Jaundice, neonatal   . PONV (postoperative nausea and vomiting) 11-21-13  . Anxiety   . 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 Gastroparesis   . Family history of anesthesia complication     mother n/v  . Migraine headache    Past Surgical History  Procedure Laterality Date  . Esophageal manometry [name] 09/09/2013    Procedure: ESOPHAGEAL MANOMETRY (EM);  Surgeon: Theda Belfast, MD;  Location: [facility];  Service: Endoscopy;  Laterality: [name];  . Spinal fusion  1992    lower back harrington rods  . Tonsillectomy    . Hand tendon surgery Left 2008    Reattachment  . Wisdom tooth extraction    . Esophagogastroduodenoscopy [name] 11/21/2013    Procedure: ESOPHAGOGASTRODUODENOSCOPY (EGD);  Surgeon: Theda Belfast, MD;  Location: [facility];  Service: Endoscopy;  Laterality: [name];  . Bravo ph study [name] 11/21/2013    Procedure: BRAVO PH STUDY;  Surgeon: Theda Belfast, MD;  Location: [facility];  Service: Endoscopy;  Laterality: [name];  . Esophagogastroduodenoscopy (egd) with propofol [name] 12/26/2013    Procedure: ESOPHAGOGASTRODUODENOSCOPY (EGD) WITH PROPOFOL;  Surgeon: Theda Belfast, MD;  Location: [facility];  Service: Endoscopy;  Laterality: [name];  . Bravo ph study [name] 12/26/2013    Procedure: BRAVO PH STUDY;  Surgeon: Theda Belfast, MD;  Location: [facility];  Service: Endoscopy;  Laterality: [name];  . Laparoscopic nissen  fundoplication [name] 03/18/2014    Procedure: LAPAROSCOPIC NISSEN FUNDOPLICATION & pylorplasty,omentapexy;  Surgeon: Karie Soda, MD;  Location: [facility];  Service: General;  Laterality: [name];   No family history on file. Social History  Substance Use Topics  . Smoking status: Never Smoker   . Smokeless tobacco: Never Used  . Alcohol Use: Yes     Comment: 2-3 glasses of wine per month   OB History    No data available     Review of Systems  HENT: Negative for facial swelling (no head injury).   Respiratory: Negative for shortness of breath.   Cardiovascular: Negative for chest pain.  Gastrointestinal: Negative for nausea, vomiting and abdominal pain.       Negative for bowel incontinence  Genitourinary: Negative for hematuria and difficulty urinating.       Negative for urinary incontinence  Musculoskeletal: Positive for back pain, joint swelling (L buttock) and arthralgias (left hip and left buttock).  Skin: Positive for color change.       + Bruising and swelling to left buttocks + Abrasion to right hand and wrist  Allergic/Immunologic: Negative for immunocompromised state.  Neurological: Negative for weakness, numbness and headaches.       Negative for saddle anesthesia  Hematological: Does not bruise/bleed easily.  A complete 10 system review of systems was obtained and all systems are negative except as noted in the HPI and PMH.    Allergies  Amoxicillin; Clindamycin/lincomycin; Other; Papaya derivatives; Septra; and Adhesive  Home Medications   Prior to Admission medications   Medication Sig Start Date End Date Taking? Authorizing Provider  EPINEPHrine (EPIPEN) 0.3 mg/0.3 mL IJ SOAJ injection Inject 0.3 mg into the muscle as needed (Allergic reaction).    Historical Provider, MD  ibuprofen (ADVIL,MOTRIN) 200 MG tablet Take 400 mg by mouth every 6 (six) hours as needed for moderate pain.    Historical Provider, MD  methocarbamol (ROBAXIN) 500 MG tablet Take 1-2 tablets  (500-1,000 mg total) by mouth every 6 (six) hours as needed for muscle spasms. Patient not taking: Reported on 09/10/2015 03/21/14   Karie Soda, MD  metoCLOPramide (REGLAN) 10 MG tablet Take 1 tablet (10 mg total) by mouth every 6 (six) hours as needed for nausea (nausea / vomiting). Patient not taking: Reported on 09/10/2015 03/18/14   Karie Soda, MD  predniSONE (DELTASONE) 50 MG tablet Take 1 tablet (50 mg total) by mouth daily. Patient not taking: Reported on 09/10/2015 03/24/14   Mirian Mo, MD  promethazine (PHENERGAN) 25 MG suppository Place 1 suppository (25 mg total) rectally every 6 (six) hours as needed for nausea. Patient not taking: Reported on 09/10/2015 03/18/14   Karie Soda, MD  tapentadol (NUCYNTA) 50 MG TABS tablet Take 1-2 tablets (50-100 mg total) by mouth every 6 (six) hours as needed for moderate pain or severe pain. Patient not taking: Reported on 09/10/2015 03/21/14   Karie Soda, MD   BP 108/76 mmHg  Pulse 72  Temp(Src) 98.4 F (36.9 C) (Oral)  Resp 16  Ht  (1.6 m)  Wt 136 lb (61.689 kg)  BMI 24.10 kg/m2  SpO2 97%  LMP 10/30/2015   Physical Exam  Constitutional: She is oriented to person, place, and time. Vital signs are normal. She appears well-developed and well-nourished.  Non-toxic appearance. No distress.  Afebrile, nontoxic, NAD  HENT:  Head: Normocephalic and atraumatic.  Mouth/Throat: Mucous membranes are normal.  Eyes: Conjunctivae and EOM are normal. Right eye exhibits no discharge. Left eye exhibits no discharge.  Neck: Normal range of motion. Neck supple.  Cardiovascular: Normal rate and intact distal pulses.   Pulmonary/Chest: Effort normal. No respiratory distress.  Abdominal: Normal appearance. She exhibits no distension.  Musculoskeletal:       Left hip: She exhibits decreased range of motion, tenderness, bony tenderness and swelling. She exhibits no deformity and no laceration.       Lumbar back: She exhibits decreased range of motion,  tenderness, bony tenderness and spasm. She exhibits no swelling and no deformity.       Back:       Legs: Lumbar spine with limited ROM due to pain with diffuse midline spinous process TTP over a midline surgical incision from a prior surgery, no bony stepoffs or deformities, with mild bilateral paraspinous muscle TTP and muscle spasms. Mild tenderness with squeezing of the pelvis, with large hematoma to the left buttock as pictured below which is swollen and indurated. Moderate tenderness to the left hip joint line extending towards the buttock, with no abnormal rotation of the lower leg, no crepitus or obvious deformity. Able to stand. Strength 5/5 in all extremities, sensation grossly intact in all extremities, gait steady but antalgic. Distal pulses intact. SEE PICTURE BELOW.   Neurological: She is alert and oriented to person, place, and time. She has normal strength. No sensory deficit.  Skin: Skin is warm and dry. Abrasion and bruising noted. No rash noted.     Superficial abrasions to the right hand and wrist. Large hematoma to left buttock as described above and pictured below.   Psychiatric: She has a normal mood and affect. Her behavior is normal.  Nursing note and vitals reviewed.     ED Course  Procedures (including critical care time)  DIAGNOSTIC STUDIES: Oxygen Saturation is 97% on RA, normal by my interpretation.    COORDINATION OF CARE: 4:50 PM-Discussed treatment plan which includes CT Pelvis with pt at bedside and pt agreed to plan.   Labs Review Labs Reviewed  POC URINE PREG, ED    Imaging Review Dg Lumbar Spine Complete  11/13/2015  CLINICAL DATA:  Slip and fall down steps today. History of lumbar fusion for scoliosis many years prior. Back and left hip pain. EXAM: LUMBAR SPINE - COMPLETE 4+ VIEW COMPARISON:  Lumbar spine radiographs 05/11/2012 FINDINGS: Posterior fusion with rod and intrapedicular hooks. The hardware is intact. There is unchanged alignment  from prior exam. No compression deformity or evidence of acute fracture. IMPRESSION: Intact posterior fusion hardware. No hardware complication or acute fracture. Electronically Signed   By: Rubye Oaks M.D.   On: 11/13/2015 18:37   Ct Pelvis Wo Contrast  11/13/2015  CLINICAL DATA:  40 year old female with extensive bruising and swelling to left buttocks after fall today. Landed on a rock. Previous history of spinal fusion. EXAM: CT PELVIS WITHOUT CONTRAST TECHNIQUE: Multidetector CT imaging of the pelvis was performed following the standard protocol without intravenous contrast. COMPARISON:  None. FINDINGS: Large soft tissue hematoma within the subcutaneous soft tissues of the left  buttock region, measuring approximately 11 x 2.5 x 8.3 cm (transverse by AP by craniocaudal dimensions), with surrounding soft tissue edema. No evidence of extension of the edema or hematoma into the underlying gluteus musculature. Osseous structures of the pelvis appear intact and normally aligned throughout. Surgical fusion hardware is incompletely imaged within the lower lumbar spine. IMPRESSION: 1. Large soft tissue hematoma within the subcutaneous soft tissues of the left buttock, measuring approximately 11 cm greatest dimension, with large amount of surrounding ill-defined edema. No extension of the edema or hematoma seen into the underlying gluteus musculature. 2. No osseous fracture or dislocation within the pelvis. Electronically Signed   By: Bary RichardStan  Maynard M.D.   On: 11/13/2015 18:59   I have personally reviewed and evaluated these images and lab results as part of my medical decision-making.   EKG Interpretation None      MDM   Final diagnoses:  Traumatic hematoma of buttock, initial encounter  Fall (on) (from) other stairs and steps, initial encounter  Left hip pain  Midline low back pain without sciatica  Contusion  Abrasion of right arm, initial encounter    40 y.o. female here s/p slip and fall  down 3 steps this morning, with back and L hip/buttock pain and bruising/swelling from a large hematoma. NVI with soft compartments in the legs, hematoma is tender and indurated but does not have any concerning findings for compartment syndrome in the buttock. No saddle anesthesia or cauda equina symptoms. Able to ambulate with assistance but very tender. Pelvic squeezing causes pain in the buttock, concerning for possible pelvic fx. Given the concern for this type of injury, will proceed with CT to r/o fx in order to get a better look at the pelvis vs plain films. Discussed case with Dr. Jodi MourningZavitz who agrees. Will also get xray of lumbar spine since radiology states we can't include the L spine on a pelvis xray. They requested a preg test. Will give pain meds now and reassess shortly. Small abrasions to R hand do not need any intervention at this time. Will reassess shortly  5:45 PM Still awaiting radiology imaging. Pt having ongoing pain after 8mg  morphine, will place IV to have ability to re-dose analgesics and give 1mg  dilaudid now. Will continue to monitor and reassess after imaging  7:34 PM Adequate pain relief from dilaudid, got nauseated but felt better after zofran. Upreg neg. Xray lumbar spine without acute injury or changes in hardware. CT pelvis without fx, shows large hematoma without extension into the gluteus. Dr. Jodi MourningZavitz in to see pt, agrees that we don't need to start prophylactic abx, but advised pt to watch for s/sx of infected hematoma. No I&D of hematoma necessary at this time. Discussed s/sx of compartment syndrome and infection to watch for, but doubt these are currently complicating the injury. Will give plenty of pain meds, discussed ice/elevation/NSAIDs. Will give crutches for comfort. F/up with PCP and/or her orthopedist in 1 week for recheck and ongoing management. I explained the diagnosis and have given explicit precautions to return to the ER including for any other new or worsening  symptoms. The patient understands and accepts the medical plan as it's been dictated and I have answered their questions. Discharge instructions concerning home care and prescriptions have been given. The patient is STABLE and is discharged to home in good condition.   I personally performed the services described in this documentation, which was scribed in my presence. The recorded information has been reviewed and is accurate.  BP 116/74 mmHg  Pulse 84  Temp(Src) 98.4 F (36.9 C) (Oral)  Resp 16  Ht  (1.6 m)  Wt 61.689 kg  BMI 24.10 kg/m2  SpO2 97%  LMP 10/30/2015  Meds ordered this encounter  Medications  . morphine 4 MG/ML injection 8 mg    Sig:   . HYDROmorphone (DILAUDID) injection 1 mg    Sig:   . ondansetron (ZOFRAN) injection 4 mg    Sig:   . oxyCODONE (ROXICODONE) 5 MG immediate release tablet    Sig: Take 1 tablet (5 mg total) by mouth every 6 (six) hours as needed for severe pain.    Dispense:  30 tablet    Refill:  0    Order Specific Question:  Supervising Provider    Answer:  Eber Hong [[id number]]        [employer]Soms, PA-C 11/13/15 1936  Blane Ohara, MD 11/13/15 2000

## 2015-11-13 NOTE — ED Notes (Signed)
Pt c/o nausea. PA notified

## 2015-11-13 NOTE — ED Notes (Signed)
Pt back from CT.  States nauseated, weak.  Zofran given. VS stable.

## 2015-11-13 NOTE — ED Notes (Signed)
Pt c/o nausea, given crackers and ginger ale.

## 2015-11-13 NOTE — ED Notes (Signed)
Pt reports she slipped and fell down three steps on her back porch. She has very large dark purple hematoma to left buttocks and reports she landed on her left hip. She denies head injury or LOC. Denies taking blood thinners. Pt ambulatory. She reports pain to bottom, back, and left hip. She also has laceration to right hand.

## 2018-02-06 IMAGING — DX DG LUMBAR SPINE COMPLETE 4+V
5 series · 5 of 5 positions shown · non-contrast
Comparison: Lumbar spine radiographs 05/11/2012

CLINICAL DATA: Slip and fall down steps today. History of lumbar
fusion for scoliosis many years prior. Back and left hip pain.

EXAM:
LUMBAR SPINE - COMPLETE 4+ VIEW

[t lumbar spine ap]
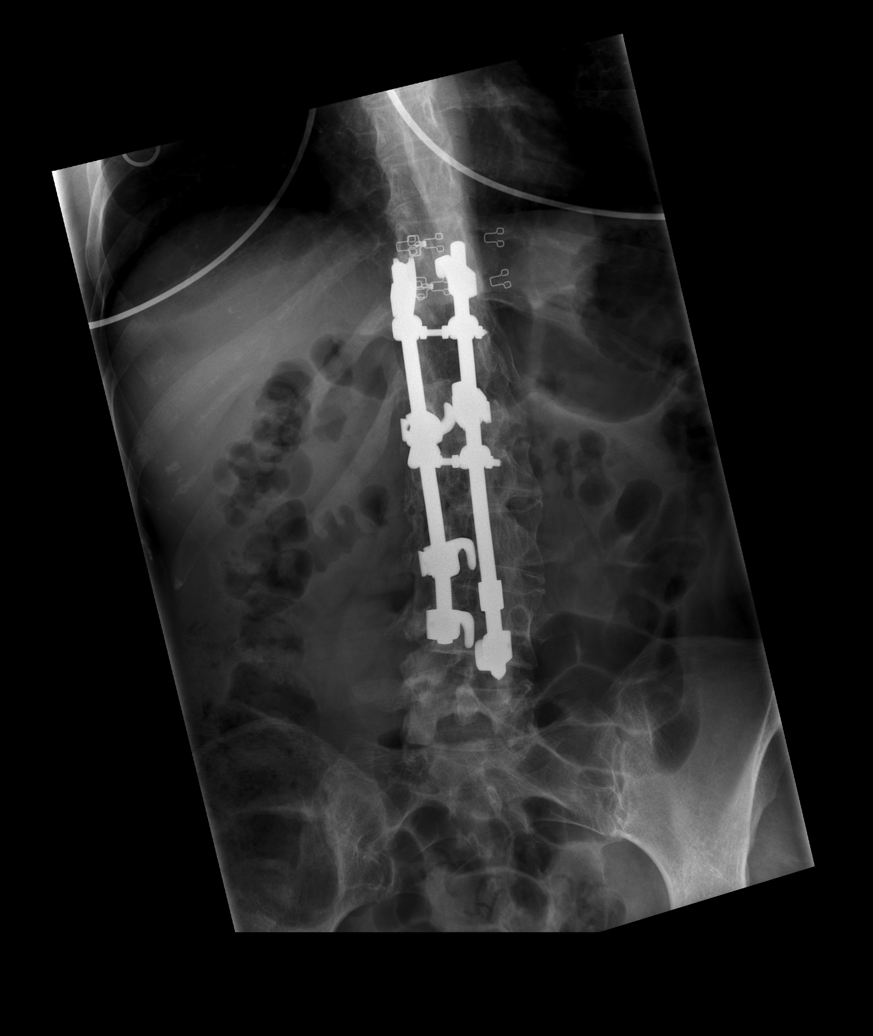

[t lumbar spine obl (1 of 2)]
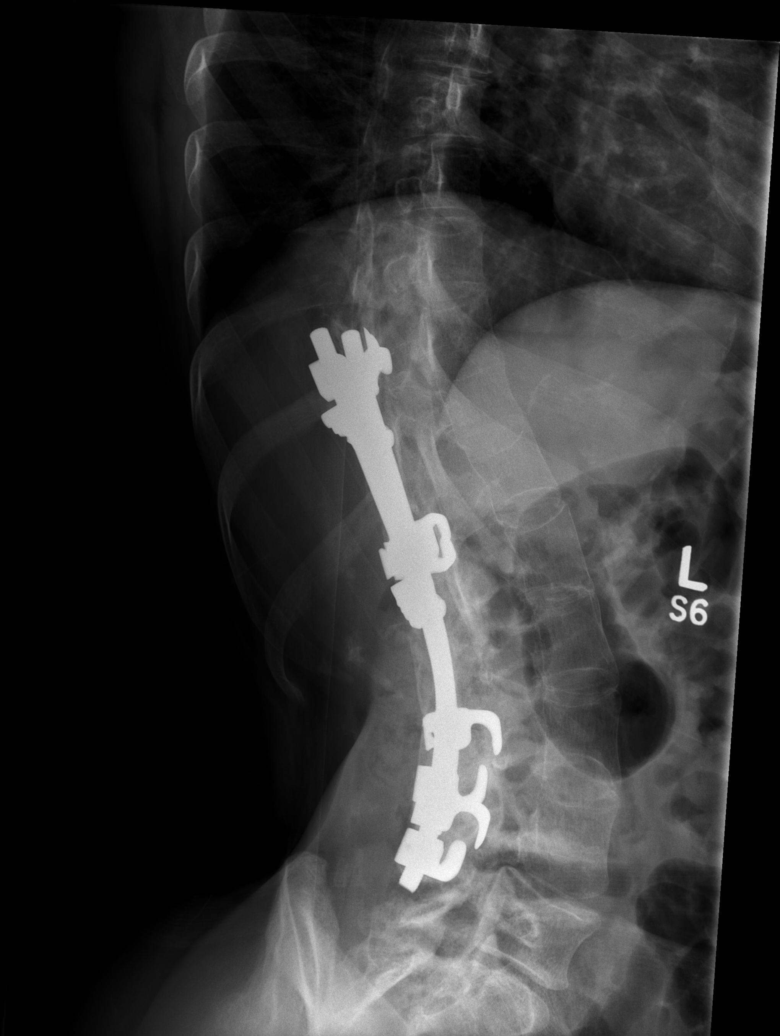

[t lumbar spine obl (2 of 2)]
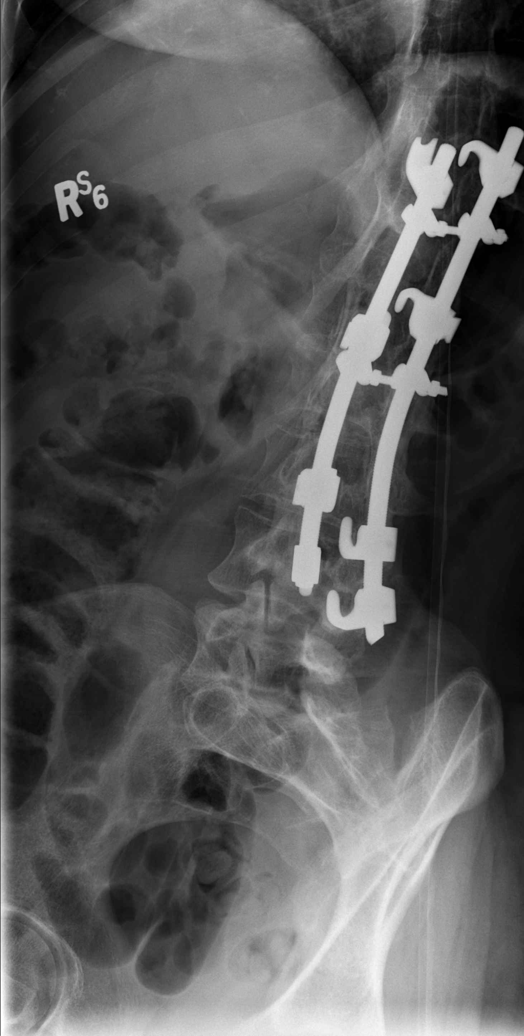

[t lumbar spine lat]
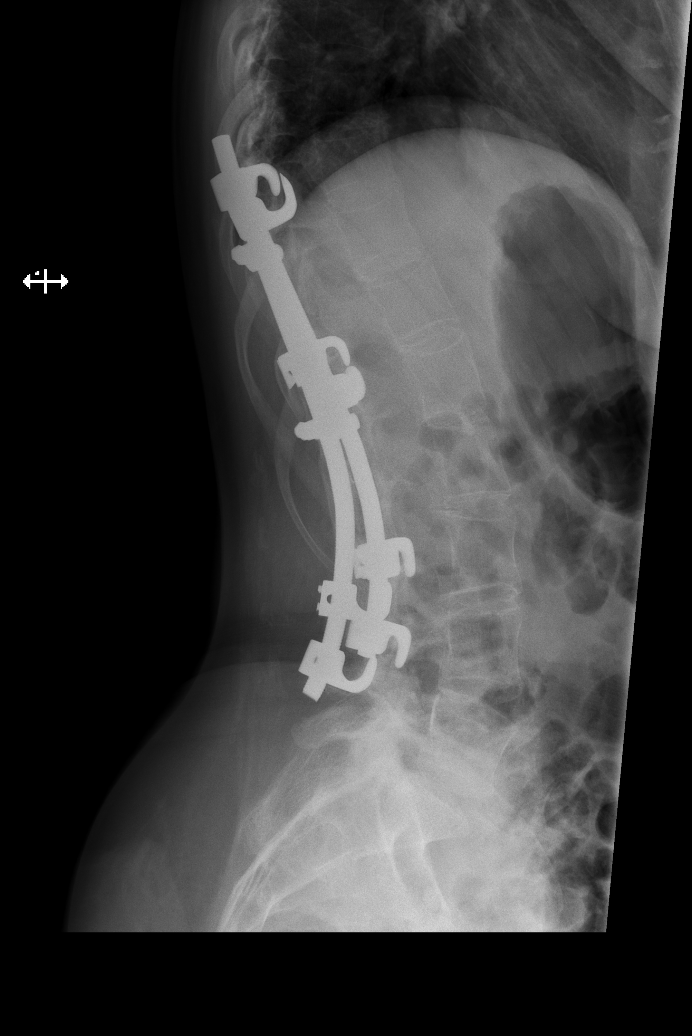

[t lumbar l-5 s-1 spot]
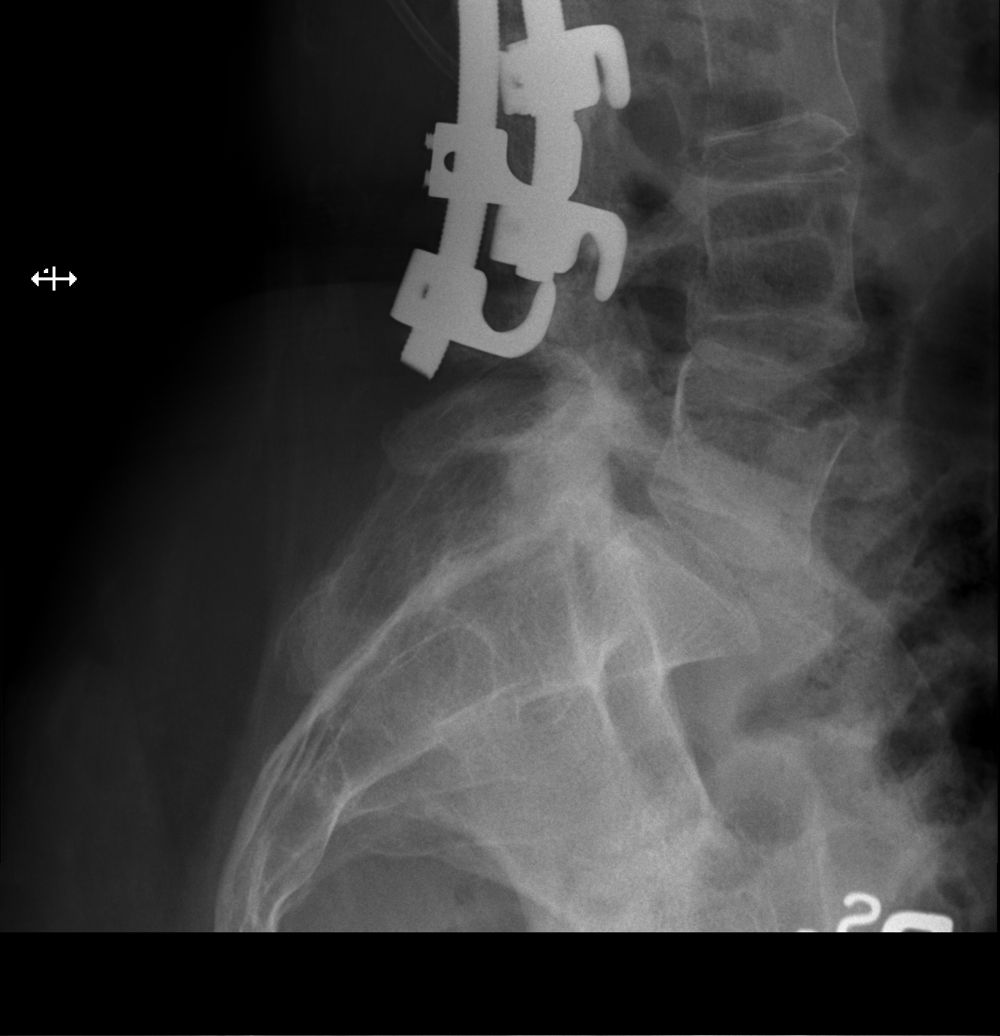

[5 of 5 positions shown; findings below may reference images not displayed]

FINDINGS: Posterior fusion with rod and intrapedicular hooks. The hardware is
intact. There is unchanged alignment from prior exam. No compression
deformity or evidence of acute fracture.
IMPRESSION: Intact posterior fusion hardware. No hardware complication or acute
fracture.
